# Patient Record
Sex: Female | Born: 1965 | Race: Black or African American | Hispanic: No | Marital: Married | Smoking: Never smoker
Health system: Southern US, Community
[De-identification: ages and names within clinical notes are randomized; demographics above are authoritative.]

## PROBLEM LIST (undated history)

## (undated) DIAGNOSIS — G4730 Sleep apnea, unspecified: Secondary | ICD-10-CM

## (undated) DIAGNOSIS — E785 Hyperlipidemia, unspecified: Secondary | ICD-10-CM

## (undated) DIAGNOSIS — K5900 Constipation, unspecified: Secondary | ICD-10-CM

## (undated) DIAGNOSIS — K219 Gastro-esophageal reflux disease without esophagitis: Secondary | ICD-10-CM

## (undated) DIAGNOSIS — T7840XA Allergy, unspecified, initial encounter: Secondary | ICD-10-CM

## (undated) DIAGNOSIS — R1310 Dysphagia, unspecified: Secondary | ICD-10-CM

## (undated) DIAGNOSIS — E0590 Thyrotoxicosis, unspecified without thyrotoxic crisis or storm: Secondary | ICD-10-CM

## (undated) DIAGNOSIS — I10 Essential (primary) hypertension: Secondary | ICD-10-CM

## (undated) DIAGNOSIS — R197 Diarrhea, unspecified: Secondary | ICD-10-CM

## (undated) DIAGNOSIS — Z8719 Personal history of other diseases of the digestive system: Secondary | ICD-10-CM

## (undated) DIAGNOSIS — E119 Type 2 diabetes mellitus without complications: Secondary | ICD-10-CM

## (undated) DIAGNOSIS — G629 Polyneuropathy, unspecified: Secondary | ICD-10-CM

## (undated) DIAGNOSIS — R011 Cardiac murmur, unspecified: Secondary | ICD-10-CM

## (undated) HISTORY — PX: CHOLECYSTECTOMY: SHX55

## (undated) HISTORY — DX: Allergy, unspecified, initial encounter: T78.40XA

## (undated) HISTORY — PX: ESOPHAGOGASTRODUODENOSCOPY: SHX1529

## (undated) HISTORY — DX: Hyperlipidemia, unspecified: E78.5

## (undated) HISTORY — DX: Essential (primary) hypertension: I10

## (undated) HISTORY — DX: Type 2 diabetes mellitus without complications: E11.9

## (undated) HISTORY — DX: Gastro-esophageal reflux disease without esophagitis: K21.9

## (undated) HISTORY — PX: OTHER SURGICAL HISTORY: SHX169

## (undated) HISTORY — DX: Cardiac murmur, unspecified: R01.1

---

## 2000-07-13 ENCOUNTER — Ambulatory Visit (HOSPITAL_COMMUNITY): Admission: RE | Admit: 2000-07-13 | Discharge: 2000-07-13 | Payer: Self-pay | Admitting: Family Medicine

## 2000-07-13 ENCOUNTER — Encounter: Payer: Self-pay | Admitting: Family Medicine

## 2000-10-14 ENCOUNTER — Encounter: Admission: RE | Admit: 2000-10-14 | Discharge: 2000-10-14 | Payer: Self-pay | Admitting: *Deleted

## 2000-10-14 ENCOUNTER — Encounter: Payer: Self-pay | Admitting: *Deleted

## 2001-02-03 ENCOUNTER — Other Ambulatory Visit: Admission: RE | Admit: 2001-02-03 | Discharge: 2001-02-03 | Payer: Self-pay | Admitting: *Deleted

## 2001-04-16 ENCOUNTER — Emergency Department (HOSPITAL_COMMUNITY): Admission: EM | Admit: 2001-04-16 | Discharge: 2001-04-16 | Payer: Self-pay | Admitting: Emergency Medicine

## 2001-04-18 ENCOUNTER — Encounter: Payer: Self-pay | Admitting: Family Medicine

## 2001-04-18 ENCOUNTER — Ambulatory Visit (HOSPITAL_COMMUNITY): Admission: RE | Admit: 2001-04-18 | Discharge: 2001-04-18 | Payer: Self-pay | Admitting: Family Medicine

## 2001-04-28 ENCOUNTER — Encounter: Payer: Self-pay | Admitting: General Surgery

## 2001-04-28 ENCOUNTER — Encounter (INDEPENDENT_AMBULATORY_CARE_PROVIDER_SITE_OTHER): Payer: Self-pay

## 2001-04-28 ENCOUNTER — Observation Stay (HOSPITAL_COMMUNITY): Admission: RE | Admit: 2001-04-28 | Discharge: 2001-04-29 | Payer: Self-pay | Admitting: General Surgery

## 2002-02-04 ENCOUNTER — Other Ambulatory Visit: Admission: RE | Admit: 2002-02-04 | Discharge: 2002-02-04 | Payer: Self-pay | Admitting: Obstetrics & Gynecology

## 2003-02-17 ENCOUNTER — Other Ambulatory Visit: Admission: RE | Admit: 2003-02-17 | Discharge: 2003-02-17 | Payer: Self-pay | Admitting: *Deleted

## 2003-03-04 ENCOUNTER — Encounter: Admission: RE | Admit: 2003-03-04 | Discharge: 2003-06-02 | Payer: Self-pay | Admitting: Family Medicine

## 2004-01-13 ENCOUNTER — Encounter: Admission: RE | Admit: 2004-01-13 | Discharge: 2004-04-12 | Payer: Self-pay | Admitting: Family Medicine

## 2004-03-01 ENCOUNTER — Other Ambulatory Visit: Admission: RE | Admit: 2004-03-01 | Discharge: 2004-03-01 | Payer: Self-pay | Admitting: *Deleted

## 2004-06-06 ENCOUNTER — Ambulatory Visit (HOSPITAL_COMMUNITY): Admission: RE | Admit: 2004-06-06 | Discharge: 2004-06-06 | Payer: Self-pay | Admitting: Family Medicine

## 2005-01-09 ENCOUNTER — Encounter: Admission: RE | Admit: 2005-01-09 | Discharge: 2005-01-09 | Payer: Self-pay | Admitting: Gastroenterology

## 2005-12-13 ENCOUNTER — Encounter: Admission: RE | Admit: 2005-12-13 | Discharge: 2005-12-13 | Payer: Self-pay | Admitting: *Deleted

## 2006-06-26 ENCOUNTER — Encounter: Admission: RE | Admit: 2006-06-26 | Discharge: 2006-06-26 | Payer: Self-pay | Admitting: Family Medicine

## 2006-10-02 ENCOUNTER — Encounter: Admission: RE | Admit: 2006-10-02 | Discharge: 2006-10-02 | Payer: Self-pay | Admitting: Gastroenterology

## 2006-10-09 ENCOUNTER — Ambulatory Visit (HOSPITAL_COMMUNITY): Admission: RE | Admit: 2006-10-09 | Discharge: 2006-10-09 | Payer: Self-pay | Admitting: Gastroenterology

## 2006-11-05 HISTORY — PX: ESSURE TUBAL LIGATION: SUR464

## 2006-11-06 ENCOUNTER — Ambulatory Visit (HOSPITAL_COMMUNITY): Admission: RE | Admit: 2006-11-06 | Discharge: 2006-11-06 | Payer: Self-pay | Admitting: *Deleted

## 2007-01-01 ENCOUNTER — Encounter: Admission: RE | Admit: 2007-01-01 | Discharge: 2007-01-01 | Payer: Self-pay | Admitting: Family Medicine

## 2007-01-10 ENCOUNTER — Ambulatory Visit (HOSPITAL_COMMUNITY): Admission: RE | Admit: 2007-01-10 | Discharge: 2007-01-10 | Payer: Self-pay | Admitting: *Deleted

## 2007-12-15 ENCOUNTER — Encounter: Admission: RE | Admit: 2007-12-15 | Discharge: 2007-12-15 | Payer: Self-pay | Admitting: *Deleted

## 2008-01-07 ENCOUNTER — Encounter: Admission: RE | Admit: 2008-01-07 | Discharge: 2008-01-07 | Payer: Self-pay | Admitting: *Deleted

## 2008-01-10 ENCOUNTER — Ambulatory Visit (HOSPITAL_COMMUNITY): Admission: RE | Admit: 2008-01-10 | Discharge: 2008-01-10 | Payer: Self-pay | Admitting: Family Medicine

## 2008-01-20 ENCOUNTER — Encounter: Admission: RE | Admit: 2008-01-20 | Discharge: 2008-01-20 | Payer: Self-pay | Admitting: *Deleted

## 2008-06-03 ENCOUNTER — Ambulatory Visit: Payer: Self-pay | Admitting: Cardiothoracic Surgery

## 2008-06-09 ENCOUNTER — Encounter: Admission: RE | Admit: 2008-06-09 | Discharge: 2008-06-09 | Payer: Self-pay | Admitting: Cardiothoracic Surgery

## 2008-06-17 ENCOUNTER — Ambulatory Visit: Payer: Self-pay | Admitting: Cardiothoracic Surgery

## 2008-09-22 ENCOUNTER — Other Ambulatory Visit: Admission: RE | Admit: 2008-09-22 | Discharge: 2008-09-22 | Payer: Self-pay | Admitting: Obstetrics & Gynecology

## 2008-10-01 ENCOUNTER — Encounter: Admission: RE | Admit: 2008-10-01 | Discharge: 2008-10-01 | Payer: Self-pay | Admitting: Obstetrics & Gynecology

## 2008-10-01 LAB — HM DEXA SCAN

## 2009-01-07 ENCOUNTER — Encounter: Admission: RE | Admit: 2009-01-07 | Discharge: 2009-01-07 | Payer: Self-pay | Admitting: Internal Medicine

## 2009-02-28 ENCOUNTER — Ambulatory Visit (HOSPITAL_BASED_OUTPATIENT_CLINIC_OR_DEPARTMENT_OTHER): Admission: RE | Admit: 2009-02-28 | Discharge: 2009-02-28 | Payer: Self-pay | Admitting: Family Medicine

## 2009-02-28 ENCOUNTER — Ambulatory Visit: Payer: Self-pay | Admitting: Diagnostic Radiology

## 2010-01-09 ENCOUNTER — Encounter: Admission: RE | Admit: 2010-01-09 | Discharge: 2010-01-09 | Payer: Self-pay | Admitting: Internal Medicine

## 2010-04-28 ENCOUNTER — Encounter: Admission: RE | Admit: 2010-04-28 | Discharge: 2010-04-28 | Payer: Self-pay | Admitting: Obstetrics & Gynecology

## 2010-05-15 ENCOUNTER — Ambulatory Visit: Payer: Self-pay | Admitting: Thoracic Surgery (Cardiothoracic Vascular Surgery)

## 2010-06-27 ENCOUNTER — Ambulatory Visit: Payer: Self-pay | Admitting: Sports Medicine

## 2010-06-27 DIAGNOSIS — E559 Vitamin D deficiency, unspecified: Secondary | ICD-10-CM | POA: Insufficient documentation

## 2010-06-27 DIAGNOSIS — M79609 Pain in unspecified limb: Secondary | ICD-10-CM | POA: Insufficient documentation

## 2010-06-27 DIAGNOSIS — R269 Unspecified abnormalities of gait and mobility: Secondary | ICD-10-CM | POA: Insufficient documentation

## 2010-06-27 DIAGNOSIS — M2140 Flat foot [pes planus] (acquired), unspecified foot: Secondary | ICD-10-CM | POA: Insufficient documentation

## 2010-06-27 DIAGNOSIS — R030 Elevated blood-pressure reading, without diagnosis of hypertension: Secondary | ICD-10-CM | POA: Insufficient documentation

## 2010-06-27 DIAGNOSIS — M722 Plantar fascial fibromatosis: Secondary | ICD-10-CM | POA: Insufficient documentation

## 2010-07-31 ENCOUNTER — Ambulatory Visit: Payer: Self-pay | Admitting: Sports Medicine

## 2010-11-26 ENCOUNTER — Encounter: Payer: Self-pay | Admitting: *Deleted

## 2010-11-27 ENCOUNTER — Encounter: Payer: Self-pay | Admitting: Cardiothoracic Surgery

## 2010-12-05 NOTE — Assessment & Plan Note (Signed)
Summary: NP,FEET ISSUES,MC   Vital Signs:  Patient profile:   45 year old female Height:      61 inches Weight:      213 pounds BMI:     40.39 BP sitting:   159 / 107  Vitals Entered By: Lillia Pauls CMA (June 27, 2010 8:54 AM)  History of Present Illness: Pt is a 45 yo AAF with bilateral foot pain for 3 years, getting progressively worse. It is a burning sensation and some tingling diffusely in the foot but moreso on the bottom and medial aspect. She has had neg nerve conduction studies, per pt; as well as normal B12 and low vit D for which she takes 2k units a day. She does not have a hx of DM but there is a family hx. She has a hx of plantar fasciitis and still uses her night splints and does her stretching and ice.  Had orthtoics made by Dr Althea Charon and they helped but did not resolve the symptoms - 45 years old?  Social History: works in [employer] office with Dr Erik Obey  Review of Systems General:  Denies chills, fatigue, fever, loss of appetite, malaise, sleep disorder, sweats, weakness, and weight loss. MS:  Complains of stiffness; denies joint pain, joint redness, joint swelling, loss of strength, low back pain, mid back pain, muscle aches, muscle , cramps, muscle weakness, and thoracic pain. Neuro:  Complains of tingling; denies brief paralysis, difficulty with concentration, disturbances in coordination, falling down, headaches, inability to speak, memory loss, numbness, poor balance, seizures, sensation of room spinning, tremors, visual disturbances, and weakness.  Physical Exam  General:  Well-developed,well-nourished,in no acute distress; alert,appropriate and cooperative throughout examination Msk:  normal ROM, no joint tenderness, no joint swelling, and no joint warmth.    Ankle: No visible erythema or swelling. Range of motion is full in all directions. Strength is 5/5 in all directions. Stable lateral and medial ligaments; squeeze test and kleiger test  unremarkable; Talar dome nontender; No pain at base of 5th MT; No tenderness over cuboid; No tenderness over N spot or navicular prominence No tenderness on posterior aspects of lateral and medial malleolus No sign of peroneal tendon subluxations; Negative tarsal tunnel tinel's Able to walk 4 steps.  ttp over proximal plantar fascia, bilaterally  Neurologic:  alert & oriented X3, strength normal in all extremities, sensation intact to light touch, and DTRs symmetrical and normal.  gait reveals overpronation and a dropping of the medial malleoli   Impression & Recommendations:  Problem # 1:  FOOT PAIN, BILATERAL (ICD-729.5) Pt does have an old orthotic, about 45 years old. It was able to be padded with medial arch support and a metatarsal pad. If this works, then she can consider custom orthotics. follow up in 1 month. 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 She will now take Vit B6. She is instructed to f/u with her pcp for her BP and vit D def next week.  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

## 2010-12-05 NOTE — Assessment & Plan Note (Signed)
Summary: orthotics,mc   Vital Signs:  Patient profile:   45 year old female Pulse rate:   74 / minute BP sitting:   140 / 90  (left arm) CC: f/u foot burning and tingling   CC:  f/u foot burning and tingling.  History of Present Illness: Samantha [name] returns w bilat foot pain described as burning somewhat less pain and less burning since we adjusted her old orthotics our plan was to make new ones today do see if we could keep her from needing meds seems like she has tarsal tunnel sxs f pronation bilat  Physical Exam  General:  Well-developed,well-nourished,in no acute distress; alert,appropriate and cooperative throughout examination  mildly obese Msk:  loss of long arch bilat pronated foot stance calcaneal valgus RT > Lt transverse arch is flattened w some splaying of toes Lt > Rt with this   Impression & Recommendations:  Problem # 1:  ABNORMALITY OF GAIT (ICD-781.2)  Patient was fitted for a standard, cushioned, semi-rigid orthotic.  The orthotic was heated and the patient stood on the orthotic blank positioned on the orthotic stand. The patient was positioned in subtalar neutral position and 10 degrees of ankle dorsiflexion in a weight bearing stance. After completion of molding a stable based was applied to the orthotic blank.   The blank was ground to a stable position for weight bearing. size 7 blue swirl base med density blue EVA posting first ray on RT additional orthotic padding  MT pads - small  time 30 mins  gait after placement looks neutral she is comfortable w them in shoes  Orders: [employer], each unit (U0454)  Problem # 2:  FOOT PAIN, BILATERAL (ICD-729.5)  still sxs of tarsal tunnel burning will keep up B6 see how new orthotics do  if no change in 8 wks consider gabapentin  Orders: [employer], each unit (L3002)  Problem # 3:  PLANTAR FASCIITIS, BILATERAL (ICD-728.71)  this is improved w better orthtoic  support  Orders: [employer], each unit ([id number])

## 2010-12-15 ENCOUNTER — Other Ambulatory Visit: Payer: Self-pay | Admitting: Obstetrics & Gynecology

## 2010-12-15 DIAGNOSIS — Z1231 Encounter for screening mammogram for malignant neoplasm of breast: Secondary | ICD-10-CM

## 2011-01-15 ENCOUNTER — Other Ambulatory Visit: Payer: Self-pay | Admitting: Internal Medicine

## 2011-01-15 ENCOUNTER — Ambulatory Visit
Admission: RE | Admit: 2011-01-15 | Discharge: 2011-01-15 | Disposition: A | Discharge status: Home / Self Care | Payer: Federal, State, Local not specified - PPO | Source: Ambulatory Visit | Attending: Obstetrics & Gynecology | Admitting: Obstetrics & Gynecology

## 2011-01-15 DIAGNOSIS — E042 Nontoxic multinodular goiter: Secondary | ICD-10-CM

## 2011-01-15 DIAGNOSIS — Z1231 Encounter for screening mammogram for malignant neoplasm of breast: Secondary | ICD-10-CM

## 2011-01-19 ENCOUNTER — Ambulatory Visit
Admission: RE | Admit: 2011-01-19 | Discharge: 2011-01-19 | Disposition: A | Discharge status: Home / Self Care | Payer: Federal, State, Local not specified - PPO | Source: Ambulatory Visit | Attending: Internal Medicine | Admitting: Internal Medicine

## 2011-01-19 DIAGNOSIS — E042 Nontoxic multinodular goiter: Secondary | ICD-10-CM

## 2011-03-20 NOTE — Assessment & Plan Note (Signed)
OFFICE VISIT   Samantha [name], Samantha [name]  DOB:  1966/07/28                                        June 17, 2008  CHART #:  [id number]   The patient returns today for followup visit with question of vascular  ring from an aberrant right subclavian artery.  The patient was  originally seen in January 2008, after being evaluated by Dr. Loreta Ave in  Gastroenterology and also by ENT.  CT scan showed aberrant right  subclavian artery and also reflux.  The patient returned to see me  several weeks ago with more symptoms of swallowing difficulty and a  repeat barium swallow was performed.  The patient does have slight  indentation in the upper portion of the esophagus consistent with the  aberrant right subclavian artery, but there is no obstruction to flow  and she easily swallowed barium tablets.  It should also be noted that  she had dysmotility of her distal esophagus with contrast pooling in the  distal esophagus and also reflux.   On exam, her blood pressure remains elevated at 175/98 in the left arm  on her previous visit and in the right arm is 163/104.  Otherwise,  physical exam is unchanged.  She has no cervical or supraclavicular  adenopathy or any neck masses.   I have reviewed the films with the patient.  She notes that since  returning to consistently taking over-the-counter Zantac 150 mg a day,  her symptoms have improved over the past several weeks.  At this point,  I would not recommend major intrathoracic vascular procedure without  better evidence that she would have improvement in her overall symptoms  and currently it appears that her symptoms are primarily reflux related.  I am also concerned about her significantly elevated blood pressure on  both of her visits here. She notes that she is not on any blood pressure  medicine.  She notes that at home her blood pressure usually runs at  120/82.  She will return to see Dr. Nicholos Johns to have her  blood  pressure checked further, and we will  reconsult with Dr. Loreta Ave for treatment of her esophageal reflux.  I do  plan to see her back in 6 months to reevaluate her symptoms.   Sheliah Plane, MD  Electronically Signed   EG/MEDQ  D:  06/17/2008  T:  06/18/2008  Job:  [id number]   cc:   Georgianne Fick, M.D.  Anselmo Rod, M.D.

## 2011-03-20 NOTE — Consult Note (Signed)
NEW PATIENT CONSULTATION   [name], [name]  DOB:  1966/02/21                                        May 15, 2010  CHART #:  [id number]   REASON FOR CONSULTATION:  Aberrant right subclavian artery.   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 Tyrone Sage in January 2008.  The patient underwent upper GI barium  contrast swallowing evaluation at that time that revealed no sign of any  significant obstruction of the esophagus at the level of the aberrant  right subclavian artery.  The patient was noted to have significant  nonspecific esophageal dysmotility with some contrast stasis in the  lower thoracic esophagus and related GE reflux that appeared to be  completely unrelated to the aberrant right subclavian artery.  The  patient recently has been undergoing a variety of tests because of  concerns related to increased weight gain over the last 6 months or so.  Thyroid function profile has been found to be normal, and a thyroid  ultrasound revealed no suspicious thyroid mass or acute abnormality.  Because of sensation of increased pressure in her neck and difficulty  sleeping at night, she also underwent a sleep study that revealed no  findings to suggest the presence of obstructive sleep apnea.  The  patient returns to our office for followup related to her aberrant right  subclavian artery, and she specifically requested a second opinion to  evaluate this condition.  She reports no difficulty swallowing at all at  this point in time with exception of the fact that occasionally she will  have trouble swallowing very large pills.  Otherwise, she has no  difficulty swallowing at all and this includes no pain or difficulty  with swallowing either liquids or solids.  She has not had problems  with  chronic cough or recurrent respiratory infections.  She reports a vague  pressure-like sensation in her neck that seems to be exacerbated by  stress and unusual positions of her neck such as tucking her chin.  She  does admit that she has gained at least 25 pounds in weight over the  last 6 months or so.  The remainder of her review of systems is entirely  unremarkable.   PAST MEDICAL HISTORY:  1. Hypertension.  2. Hyperlipidemia.  3. GE reflux disease.  4. Obesity.  5. Bilateral foot pain.   FAMILY HISTORY:  Noncontributory.   SOCIAL HISTORY:  The patient is married with one 62 year old son.  She  lives in [place].  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  She does not smoke and she denies significant  alcohol consumption.   CURRENT MEDICATIONS:  Zantac, Caltrate, Cymbalta, fish oil capsule,  vitamin D, oral contraceptive pill, and multivitamin.   PHYSICAL EXAMINATION:  Notable for well-appearing obese female with  blood pressure 151/98, pulse 92, and oxygen saturation 99% on room air.  HEENT exam is notable for the absence of any palpable lymphadenopathy.  No thyroid masses can be appreciated.  Auscultation of the chest reveals  clear breath sounds that are symmetrical bilaterally.  No wheezes,  rales, or rhonchi are noted.  Cardiovascular exam includes regular rate  and rhythm.  No murmurs, rubs, or gallops are noted.  The abdomen is  soft and nontender.  The extremities are warm and well perfused.  There  is no lower extremity edema.  Distal pulses are palpable.   DIAGNOSTIC TESTS:  Chest CT scan performed October 02, 2006, is  reviewed.  This demonstrates aberrant right subclavian artery arising  from the proximal descending thoracic aorta and traversing posterior to  the esophagus.  There is otherwise normal left sided aortic arch and no  other arch anomalies are appreciated.  Upper GI contrast study performed  August 2009, is  reviewed.  This demonstrates very slight indentation of  the esophagus at the level of the aberrant right subclavian artery with  no evidence for obstruction whatsoever.  Abnormal motility was noted at  that time as discussed previously.   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  At her age, it would be somewhat unusual for this  condition to ever require surgical intervention.  She denies any  symptoms of possible esophageal obstruction and previous upper  gastrointestinal contrast study revealed no evidence for obstruction.   RECOMMENDATIONS:  I do not recommend any further workup at this time.  If the patient develops significant symptoms of possible esophageal  obstruction, then repeat upper GI barium contrast study would be  indicated to evaluate the source of obstruction.  Repeat CT angiogram  with more dedicated examination of the anatomy might be useful as well,  although at this time I  think it would be entirely a waste of resources.  All of her questions  have been addressed.  The patient will call or return to see [place] as  needed in the future.   Samantha [name], M.D.  Electronically Signed   CHO/MEDQ  D:  05/15/2010  T:  05/16/2010  Job:  [id number]   cc:   Samantha [name], M.D.  Samantha Keas, MD

## 2011-03-20 NOTE — Assessment & Plan Note (Signed)
OFFICE VISIT   Samantha [name], Samantha [name]  DOB:  07-Nov-1965                                        June 03, 2008  CHART #:  [id number]   The patient was originally seen in January of 2008, for a question of  vascular ring.  She is a 45 year old female who had noticed some  discomfort in her posterior throat and was evaluated by ENT and  Gastroenterology, and ultimately referred to me for an apparent left  subclavian artery.  At that time, her studies were reviewed.  Her  symptoms were minimal and there was no evidence of deformity,  indentation, or dysfunction of her esophagus on esophagram, and did not  think a major vascular procedure was warranted at that time.  The  patient returns today for followup.  She notes some intermittent  sensation of difficulty swallowing, but is not constant.  She is able to  take a diet without difficulty, but came for reevaluation.   PHYSICAL EXAMINATION:  VITAL SIGNS:  Her blood pressure in the right arm  was 163/104, pulse was 88, respiratory rate was 18, and O2 sats were  99%.  NECK:  On exam, I do not appreciate any cervical or supraclavicular  adenopathy.  There is no cervical bruits appreciated.  EXTREMITIES:  Pulses are equal in both arms.  CARDIAC:  Regular rate and rhythm without murmur or gallop.   The patient's previous studies were reviewed after discussing whether we  decide to proceed with a repeat esophageal swallow to evaluate the upper  esophagus particularly and then I will see her back in the office to  further discuss the evaluation and then decide if CT scan or MRI is  indicated  to evaluate, if there has been any change in the size of the subclavian  artery or potential development of aneurysmal dilatation of the  subclavian artery and its apparent position.   Sheliah Plane, MD  Electronically Signed   EG/MEDQ  D:  06/03/2008  T:  06/04/2008  Job:  [id number]   cc:   Anselmo Rod, M.D.  Georgianne Fick, M.D.

## 2011-03-23 NOTE — Op Note (Signed)
Samantha [name], Samantha [name]             ACCOUNT NO.:  [id number]   MEDICAL RECORD NO.:  [id number]          PATIENT TYPE:  AMB   LOCATION:  [facility]                           FACILITY:  [facility]   PHYSICIAN:  Knowlton B. Earlene Plater, M.D.  DATE OF BIRTH:  November 16, 1965   DATE OF PROCEDURE:  11/06/2006  DATE OF DISCHARGE:                               OPERATIVE REPORT   PREOPERATIVE DIAGNOSIS:  Desires tubal sterilization.   POSTOPERATIVE DIAGNOSIS:  Desires tubal sterilization.   PROCEDURE:  Essure tubal sterilization.   SURGEON:  Chester Holstein. Earlene Plater, M.D.   ASSISTANT:  None.   ANESTHESIA:  MAC and 20 mL of 1% Nesacaine paracervical block.   SPECIMENS:  None.   BLOOD LOSS:  Minimal.   COMPLICATIONS:  None.   FLUID DEFICIT:  75 mL.   FINDINGS:  Normal appearing uterine cavity, no masses, good endometrial  suppression, 12 coils were visible on the left, 6 on the right, after  placement.   INDICATIONS:  The patient desires permanent tubal sterilization.  Preferred the Essure for its minimally invasive aspects and the fastest  possible recovery time.  The patient was advised of the risks of surgery  including infection, bleeding, perforation, damage to surrounding  organs.  In addition, she was counseled regarding ectopic risks should  failure occur as well as the 5% to 10% nonvisualization or  noncannulation rate with Essure and the 3 month post op HSG per FDA.   PROCEDURE:  The patient was taken to the operating room and MAC  anesthesia obtained.  She was placed in Worton stirrups, prepped and  draped in the standard fashion.  The bladder was emptied with in and out  cath.  Examination under anesthesia showed a retroverted normal size  uterus and no adnexal masses.   Speculum inserted.  Paracervical block placed.  Anterior lip of the  cervix grasped with a single tooth.  The uterus was sounded and was  confirmed to be retroinverted. The Essure scope was inserted after being  flushed with good  uterine distention and the tubal ostia were  visualized.  The left was approached first and the Essure device  inserted to the black depth marker and the coil released in the standard  fashion.  12 coils were viable after placement.  Procedure repeated on  the right in the same manner and 6 coils were visible prior to placement  of the device.   The scope was removed and the cervix was hemostatic. The patient  tolerated the procedure well.  There were no complications.  She was  taken to the recovery room in stable condition.      Gerri Spore B. Earlene Plater, M.D.  Electronically Signed     WBD/MEDQ  D:  11/06/2006  T:  11/06/2006  Job:  [id number]

## 2011-03-23 NOTE — Op Note (Signed)
[facility]  Patient:    Samantha [name], Samantha [name]                    MRN: [id number] Proc. Date: 04/28/01 Adm. Date:  [id number] Attending:  Glenna Fellows Tappan                           Operative Report  PREOPERATIVE DIAGNOSIS:  Symptomatic cholelithiasis.  POSTOPERATIVE DIAGNOSIS:  Symptomatic cholelithiasis.  PROCEDURE:  Laparoscopic cholecystectomy with intraoperative cholangiogram.  ASSISTANT:  Dr. Mosetta Anis.  ANESTHESIA:  General.  BRIEF HISTORY:  This patient is a healthy 45 year old female who presents with episodic severe epigastric abdominal pain. She has been evaluated in the emergency room and found to have minimally elevated LFTs with an AST of 52 and subsequently a gallbladder ultrasound was obtained which reveals multiple gallstones and a normal common bile duct. Laparoscopic cholecystectomy with cholangiogram has been recommended and accepted. The nature of the procedure, its indications and risks of bleeding, infection, bile leak and bile duct injury were discussed and understood preoperatively. The patient is now brought to the operating room for this procedure.  DESCRIPTION OF PROCEDURE:  The patient was brought to the operating room and placed in supine position on the operating table and general endotracheal anesthesia was induced. The abdomen was sterilely prepped and draped. She received preoperative antibiotics. PAS were place. Local anesthesia was used to infiltrate the trocar sites prior to the incision. A 1 cm incision was made at the umbilicus and dissection carried down the midline fascia. This was sharply incised for 1 cm and the peritoneum entered under direct vision. Through a mattress suture of #0 Vicryl, the Hasson trocar was inserted, pneumoperitoneum established. Under direct vision, the 10 mm trocar was placed in the subxiphoid area and two 5 mm trocars along the right subcostal margin. The gallbladder was  visualized and the fundus grasped and elevated up over the liver and the infundibulum retracted inferolaterally. The gallbladder was literally packed with stones. There was some mild thickening of the gallbladder wall. Fibrofatty tissue was stripped down off the neck of the gallbladder and toward the porta hepatis. The distal gallbladder was thoroughly dissected. The gallbladder cystic duct junction was identified and the cystic duct dissected over about a centimeter and the cystic duct gallbladder junction dissected 360 degrees. Calots triangle was thoroughly dissected and the cystic artery identified coursing up on the gallbladder wall. When the anatomy was clear, the cystic duct was clipped at its junction with the gallbladder and operative cholangiogram obtained through the cystic duct. This showed good filling of the intrahepatic ducts and common bile duct with free flow into the duodenum and no filling defects. Following this, the cholangiogcath was removed and the cystic duct was doubly clipped proximally and divided. Anterior and posterior branches of the cystic artery were controlled between clips and divided. The gallbladder was dissected free from its hook and the spatula cautery and removed through the umbilicus. Complete hemostasis was assured in the operative site. Trocars removed under direct vision. All CO2 evacuated from the peritoneal cavity. The pursestring suture was secured at the umbilicus. The skin incisions were closed subcuticular 4-0 monocryl and Steri-Strips. Sponge, needle and instrument counts were correct. A dry sterile dressing was applied and the patient taken to the recovery room in good condition. DD:  04/28/01 TD:  04/28/01 Job: [id number]

## 2011-11-16 ENCOUNTER — Other Ambulatory Visit: Payer: Self-pay | Admitting: Obstetrics & Gynecology

## 2011-11-16 DIAGNOSIS — Z1231 Encounter for screening mammogram for malignant neoplasm of breast: Secondary | ICD-10-CM

## 2012-01-21 ENCOUNTER — Ambulatory Visit
Admission: RE | Admit: 2012-01-21 | Discharge: 2012-01-21 | Disposition: A | Discharge status: Home / Self Care | Payer: Federal, State, Local not specified - PPO | Source: Ambulatory Visit | Attending: Obstetrics & Gynecology | Admitting: Obstetrics & Gynecology

## 2012-01-21 DIAGNOSIS — Z1231 Encounter for screening mammogram for malignant neoplasm of breast: Secondary | ICD-10-CM

## 2012-10-31 ENCOUNTER — Other Ambulatory Visit: Payer: Self-pay | Admitting: Obstetrics & Gynecology

## 2012-10-31 DIAGNOSIS — Z1231 Encounter for screening mammogram for malignant neoplasm of breast: Secondary | ICD-10-CM

## 2012-11-24 LAB — HM PAP SMEAR: HM Pap smear: NEGATIVE

## 2013-01-26 ENCOUNTER — Ambulatory Visit
Admission: RE | Admit: 2013-01-26 | Discharge: 2013-01-26 | Disposition: A | Discharge status: Home / Self Care | Payer: Federal, State, Local not specified - PPO | Source: Ambulatory Visit | Attending: Obstetrics & Gynecology | Admitting: Obstetrics & Gynecology

## 2013-01-26 DIAGNOSIS — Z1231 Encounter for screening mammogram for malignant neoplasm of breast: Secondary | ICD-10-CM

## 2013-02-06 ENCOUNTER — Other Ambulatory Visit: Payer: Self-pay | Admitting: Family Medicine

## 2013-02-06 DIAGNOSIS — E042 Nontoxic multinodular goiter: Secondary | ICD-10-CM

## 2013-02-09 ENCOUNTER — Other Ambulatory Visit: Payer: Federal, State, Local not specified - PPO

## 2013-02-09 ENCOUNTER — Ambulatory Visit
Admission: RE | Admit: 2013-02-09 | Discharge: 2013-02-09 | Disposition: A | Discharge status: Home / Self Care | Payer: Federal, State, Local not specified - PPO | Source: Ambulatory Visit | Attending: Family Medicine | Admitting: Family Medicine

## 2013-02-09 DIAGNOSIS — E042 Nontoxic multinodular goiter: Secondary | ICD-10-CM

## 2013-08-27 ENCOUNTER — Ambulatory Visit (INDEPENDENT_AMBULATORY_CARE_PROVIDER_SITE_OTHER): Payer: Federal, State, Local not specified - PPO | Admitting: General Surgery

## 2013-09-01 ENCOUNTER — Encounter (INDEPENDENT_AMBULATORY_CARE_PROVIDER_SITE_OTHER): Payer: Self-pay

## 2013-09-01 ENCOUNTER — Encounter (INDEPENDENT_AMBULATORY_CARE_PROVIDER_SITE_OTHER): Payer: Self-pay | Admitting: General Surgery

## 2013-09-01 ENCOUNTER — Ambulatory Visit (INDEPENDENT_AMBULATORY_CARE_PROVIDER_SITE_OTHER): Payer: Federal, State, Local not specified - PPO | Admitting: General Surgery

## 2013-09-01 VITALS — BP 126/82 | HR 60 | Temp 98.2°F | Resp 14 | Ht 61.0 in | Wt 219.4 lb

## 2013-09-01 DIAGNOSIS — R1033 Periumbilical pain: Secondary | ICD-10-CM | POA: Insufficient documentation

## 2013-09-01 NOTE — Patient Instructions (Signed)
Plan for CT to look for umbilical hernia

## 2013-09-03 ENCOUNTER — Other Ambulatory Visit: Payer: Federal, State, Local not specified - PPO

## 2013-09-05 DIAGNOSIS — E119 Type 2 diabetes mellitus without complications: Secondary | ICD-10-CM

## 2013-09-05 HISTORY — DX: Type 2 diabetes mellitus without complications: E11.9

## 2013-09-07 ENCOUNTER — Ambulatory Visit
Admission: RE | Admit: 2013-09-07 | Discharge: 2013-09-07 | Disposition: A | Discharge status: Home / Self Care | Payer: Federal, State, Local not specified - PPO | Source: Ambulatory Visit | Attending: General Surgery | Admitting: General Surgery

## 2013-09-07 DIAGNOSIS — R1033 Periumbilical pain: Secondary | ICD-10-CM

## 2013-09-07 MED ORDER — IOHEXOL 300 MG/ML  SOLN
125.0000 mL | Freq: Once | INTRAMUSCULAR | Status: AC | PRN
  Administered 2013-09-07: 125 mL via INTRAVENOUS

## 2013-09-09 ENCOUNTER — Telehealth (INDEPENDENT_AMBULATORY_CARE_PROVIDER_SITE_OTHER): Payer: Self-pay

## 2013-09-09 NOTE — Telephone Encounter (Signed)
LMOM> CT did not show an umbilical hernia. It does show a ventral hernia. I cannot see Dr Billey Chang office note so I am not sure where her abdominal pain is or if the pain is coming from this ventral hernia. Will have to discuss this next week with Dr Carolynne Edouard when he is back in the office.

## 2013-09-09 NOTE — Telephone Encounter (Signed)
Message copied by Brennan Bailey on Wed Sep 09, 2013  4:38 PM ------      Message from: Isaias Sakai K      Created: Wed Sep 09, 2013  9:29 AM      Regarding: Dr Alvester Morin: [phone]       Wants CT scan results ------

## 2013-09-10 NOTE — Telephone Encounter (Signed)
Pt returned call. Pt advised of attached msg from [place]. Pt will await call back after test results reviewed with Dr Carolynne Edouard.

## 2013-09-17 NOTE — Telephone Encounter (Signed)
LMOM> Dr Carolynne Edouard reviewed CT images. He states he sees a small defect around umbilical area that looks like umbilical hernia even though the report said she does not have one. She will need to make a follow up visit with him to discuss surgery if she is wanting this surgically repaired.

## 2013-09-22 NOTE — Telephone Encounter (Signed)
Pt calling in asking for clarification of message left by [facility].  Relayed message below.  Pt unsure if she wishes to proceed with surgery.  Advised her to call our office if/when she has made a decision.

## 2013-09-23 NOTE — Progress Notes (Signed)
Patient ID: Samantha [name], female   DOB: May 05, 1966, 47 y.o.   MRN: [id number]  Chief Complaint  Patient presents with  . New Evaluation    eval RIH    HPI Samantha [name] is a 47 y.o. female.  We are asked to see the pt in consultation by Dr. Hyacinth Meeker to evaluate her for a ventral hernia. The pt is a 47 yo female who has been experiencing pain near her umbilicus for the last month. She notices it mostly when she leans over a washer. She denies any nausea or vomiting. She does have problems with constipation.  HPI  Past Medical History  Diagnosis Date  . GERD (gastroesophageal reflux disease)   . Heart murmur   . Hyperlipidemia   . Hypertension     Past Surgical History  Procedure Laterality Date  . Cholecystectomy      Family History  Problem Relation Age of Onset  . Cancer Mother     ovarian    Social History History  Substance Use Topics  . Smoking status: Never Smoker   . Smokeless tobacco: Never Used  . Alcohol Use: No    Allergies  Allergen Reactions  . Latex Hives    Current Outpatient Prescriptions  Medication Sig Dispense Refill  . cetirizine (ZYRTEC) 10 MG tablet Take 10 mg by mouth daily.      . fish oil-omega-3 fatty acids 1000 MG capsule Take 2 g by mouth daily.      Marland Kitchen JOLIVETTE 0.35 MG tablet       . LYRICA 100 MG capsule       . omeprazole (PRILOSEC) 40 MG capsule        No current facility-administered medications for this visit.    Review of Systems Review of Systems  Constitutional: Negative.   HENT: Negative.   Eyes: Negative.   Respiratory: Negative.   Cardiovascular: Negative.   Gastrointestinal: Positive for abdominal pain and constipation. Negative for vomiting and abdominal distention.  Endocrine: Negative.   Genitourinary: Negative.   Musculoskeletal: Negative.   Skin: Negative.   Allergic/Immunologic: Negative.   Neurological: Negative.   Hematological: Negative.   Psychiatric/Behavioral: Negative.     Blood pressure  126/82, pulse 60, temperature 98.2 F (36.8 C), temperature source Temporal, resp. rate 14, height 5\' 1"  (1.549 m), weight 219 lb 6.4 oz (99.519 kg).  Physical Exam Physical Exam  Constitutional: She is oriented to person, place, and time. She appears well-developed and well-nourished.  HENT:  Head: Normocephalic and atraumatic.  Eyes: Conjunctivae and EOM are normal. Pupils are equal, round, and reactive to light.  Neck: Normal range of motion. Neck supple.  Cardiovascular: Normal rate, regular rhythm and normal heart sounds.   Pulmonary/Chest: Effort normal and breath sounds normal.  Abdominal: Soft. Bowel sounds are normal.  She does have tenderness centrally. I do not feel a definite fascial defect  Musculoskeletal: Normal range of motion.  Neurological: She is alert and oriented to person, place, and time.  Skin: Skin is warm and dry.  Psychiatric: She has a normal mood and affect. Her behavior is normal.    Data Reviewed As above  Assessment    The pt has abdominal pain but it is difficult to appreciate a fascial defect that would be indicative of a hernia. Because of this I would recommend getting a CT of the abdomen and pelvis to look for evidence of a hernia that could be causing her pain     Plan  Plan for CT abd/pelvis. We will call her with the results of the study and then proceed accordingly        TOTH III,PAUL S 09/23/2013, 9:46 AM

## 2013-10-30 ENCOUNTER — Ambulatory Visit (INDEPENDENT_AMBULATORY_CARE_PROVIDER_SITE_OTHER): Payer: Federal, State, Local not specified - PPO | Admitting: General Surgery

## 2013-10-30 ENCOUNTER — Encounter (INDEPENDENT_AMBULATORY_CARE_PROVIDER_SITE_OTHER): Payer: Self-pay

## 2013-10-30 ENCOUNTER — Encounter (INDEPENDENT_AMBULATORY_CARE_PROVIDER_SITE_OTHER): Payer: Self-pay | Admitting: General Surgery

## 2013-10-30 VITALS — BP 125/81 | HR 81 | Temp 98.1°F | Resp 14 | Ht 61.0 in | Wt 214.0 lb

## 2013-10-30 DIAGNOSIS — K439 Ventral hernia without obstruction or gangrene: Secondary | ICD-10-CM

## 2013-10-30 NOTE — Progress Notes (Signed)
Patient ID: Samantha [name], female   DOB: [birth year], 47 y.o.   MRN: [id number]  Chief Complaint  Patient presents with  . Follow-up    HPI Samantha [name] is a 47 y.o. female.  The patient is a 47 year old black female who we saw recently with some abdominal pain. Since her last visit we had her undergo a CT scan of her abdomen which did show a small ventral hernia just above the umbilicus with some fat within the hernia. She has not had any nausea or vomiting. She continues to have pain across her abdomen. She denies any fevers or chills.  HPI  Past Medical History  Diagnosis Date  . GERD (gastroesophageal reflux disease)   . Heart murmur   . Hyperlipidemia   . Hypertension     Past Surgical History  Procedure Laterality Date  . Cholecystectomy      Family History  Problem Relation Age of Onset  . Cancer Mother     ovarian    Social History History  Substance Use Topics  . Smoking status: Never Smoker   . Smokeless tobacco: Never Used  . Alcohol Use: No    Allergies  Allergen Reactions  . Latex Hives    Current Outpatient Prescriptions  Medication Sig Dispense Refill  . cetirizine (ZYRTEC) 10 MG tablet Take 10 mg by mouth daily.      . fish oil-omega-3 fatty acids 1000 MG capsule Take 2 g by mouth daily.      . hydrochlorothiazide (HYDRODIURIL) 25 MG tablet       . JOLIVETTE 0.35 MG tablet       . LYRICA 100 MG capsule       . metformin (FORTAMET) 1000 MG (OSM) 24 hr tablet       . NATURE-THROID 81.25 MG TABS       . omeprazole (PRILOSEC) 40 MG capsule        No current facility-administered medications for this visit.    Review of Systems Review of Systems  Constitutional: Negative.   HENT: Negative.   Eyes: Negative.   Respiratory: Negative.   Cardiovascular: Negative.   Gastrointestinal: Positive for abdominal pain.  Endocrine: Negative.   Genitourinary: Negative.   Musculoskeletal: Negative.   Skin: Negative.   Allergic/Immunologic:  Negative.   Neurological: Negative.   Hematological: Negative.   Psychiatric/Behavioral: Negative.     Blood pressure 125/81, pulse 81, temperature 98.1 F (36.7 C), temperature source Temporal, resp. rate 14, height 5\' 1"  (1.549 m), weight 214 lb (97.07 kg).  Physical Exam Physical Exam  Constitutional: She is oriented to person, place, and time. She appears well-developed and well-nourished.  HENT:  Head: Normocephalic and atraumatic.  Eyes: Conjunctivae and EOM are normal. Pupils are equal, round, and reactive to light.  Neck: Normal range of motion. Neck supple.  Cardiovascular: Normal rate, regular rhythm and normal heart sounds.   Pulmonary/Chest: Effort normal and breath sounds normal.  Abdominal: Soft. Bowel sounds are normal. There is tenderness.  Musculoskeletal: Normal range of motion.  Lymphadenopathy:    She has no cervical adenopathy.  Neurological: She is alert and oriented to person, place, and time.  Skin: Skin is warm and dry.  Psychiatric: She has a normal mood and affect. Her behavior is normal.    Data Reviewed As above  Assessment    The patient does appear to have a small upper midline ventral hernia with some fat within the hernia. Because of the risk of incarceration and strangulation  and the risk of the hernia getting larger I think she would benefit from having this fixed. She would also like to have this done. I've discussed with her in detail the risks and benefits of the operation to fix the hernia as well as some of the technical aspects including use of mesh and she understands and wishes to proceed     Plan    Plan for laparoscopic ventral hernia repair with mesh        TOTH III,Samantha [name] 10/30/2013, 4:31 PM

## 2013-11-09 ENCOUNTER — Other Ambulatory Visit: Payer: Self-pay | Admitting: Nurse Practitioner

## 2013-11-09 NOTE — Telephone Encounter (Signed)
Patient has AEX scheduled for 11/27/13//kn

## 2013-11-26 ENCOUNTER — Encounter: Payer: Self-pay | Admitting: Nurse Practitioner

## 2013-11-27 ENCOUNTER — Ambulatory Visit (INDEPENDENT_AMBULATORY_CARE_PROVIDER_SITE_OTHER): Payer: Federal, State, Local not specified - PPO | Admitting: Nurse Practitioner

## 2013-11-27 ENCOUNTER — Encounter: Payer: Self-pay | Admitting: Nurse Practitioner

## 2013-11-27 VITALS — BP 140/86 | HR 72 | Ht 61.0 in | Wt 213.0 lb

## 2013-11-27 DIAGNOSIS — Z0000 Encounter for general adult medical examination without abnormal findings: Secondary | ICD-10-CM

## 2013-11-27 DIAGNOSIS — M8580 Other specified disorders of bone density and structure, unspecified site: Secondary | ICD-10-CM

## 2013-11-27 DIAGNOSIS — Z01419 Encounter for gynecological examination (general) (routine) without abnormal findings: Secondary | ICD-10-CM

## 2013-11-27 DIAGNOSIS — M899 Disorder of bone, unspecified: Secondary | ICD-10-CM

## 2013-11-27 DIAGNOSIS — M949 Disorder of cartilage, unspecified: Secondary | ICD-10-CM

## 2013-11-27 DIAGNOSIS — E559 Vitamin D deficiency, unspecified: Secondary | ICD-10-CM

## 2013-11-27 LAB — POCT URINALYSIS DIPSTICK
Bilirubin, UA: NEGATIVE
Blood, UA: NEGATIVE
Glucose, UA: NEGATIVE
Ketones, UA: NEGATIVE
Leukocytes, UA: NEGATIVE
Nitrite, UA: NEGATIVE
Protein, UA: NEGATIVE
Urobilinogen, UA: NEGATIVE
pH, UA: 5

## 2013-11-27 LAB — HEMOGLOBIN, FINGERSTICK: Hemoglobin, fingerstick: 14.5 g/dL (ref 12.0–16.0)

## 2013-11-27 NOTE — Progress Notes (Signed)
Patient ID: Samantha DeutscherJennifer L Ruacho, female   DOB: 02-09-1966, 48 y.o.   MRN: [id number] 48 y.o. G2P0011 Married African American Fe here for annual exam. Menses for past few years are at 3-4 months apart. When periods occur they are light and last for 3 days. No cramps. On [employer]ronor POP since 11/2006.  Getting ready to have umbilical hernia repair in February. Had HGB AIC in November secondary to neuropathy in legs.  So lab was elevated and started on Metformin and Lyrica for pain.  Last HGB AIC was 6.4.    Patient's last menstrual period was 07/06/2013.          Sexually active: yes  The current method of family planning is oral progesterone-only contraceptive.   Since 11/2006 Exercising: yes  Home exercise routine includes walking 30 minutes or 2 miles 3 days per week. Smoker:  no  Health Maintenance: Pap: 11/24/12, WNL, neg HR HPV MMG: 01/27/13, Bi-Rads:  negative BMD:  10/01/08, mild osteopenia in spine TDaP:  11/24/12 Labs:  HB:  14.5 Urine: negative   reports that she has never smoked. She has never used smokeless tobacco. She reports that she does not drink alcohol or use illicit drugs.  Past Medical History  Diagnosis Date  . GERD (gastroesophageal reflux disease)   . Heart murmur   . Hyperlipidemia   . Hypertension   . Diabetes mellitus without complication 09/2013    Past Surgical History  Procedure Laterality Date  . Cholecystectomy    . Essure tubal ligation  11/2006    one tube still open    Current Outpatient Prescriptions  Medication Sig Dispense Refill  . b complex vitamins tablet Take 1 tablet by mouth daily.      . Calcium Carbonate (CALTRATE 600 PO) Take by mouth.      . cetirizine (ZYRTEC) 10 MG tablet Take 10 mg by mouth daily.      . hydrochlorothiazide (HYDRODIURIL) 25 MG tablet       . JOLIVETTE 0.35 MG tablet TAKE ONE TABLET BY MOUTH EVERY DAY  84 tablet  0  . LYRICA 100 MG capsule       . metformin (FORTAMET) 1000 MG (OSM) 24 hr tablet       . NATURE-THROID  81.25 MG TABS        No current facility-administered medications for this visit.    Family History  Problem Relation Age of Onset  . Multiple births Mother   . Ovarian cancer Mother   . Hypertension Mother   . Hypertension Father   . Heart disease Father   . Heart attack Father   . Diabetes Brother   . Thyroid disease Paternal Aunt   . Thyroid disease Paternal Grandmother   . Osteoporosis Other   . Hypertension Brother   . Hypertension Sister     ROS:  Pertinent items are noted in HPI.  Otherwise, a comprehensive ROS was negative.  Exam:   BP 140/86  Pulse 72  Ht 5\' 1"  (1.549 m)  Wt 213 lb (96.616 kg)  BMI 40.27 kg/m2  LMP 07/06/2013 Height: 5\' 1"  (154.9 cm)  Ht Readings from Last 3 Encounters:  11/27/13 5\' 1"  (1.549 m)  10/30/13 5\' 1"  (1.549 m)  09/01/13 5\' 1"  (1.549 m)    General appearance: alert, cooperative and appears stated age Head: Normocephalic, without obvious abnormality, atraumatic Neck: no adenopathy, supple, symmetrical, trachea midline and thyroid normal to inspection and palpation Lungs: clear to auscultation bilaterally Breasts: normal appearance, no  masses or tenderness Heart: regular rate and rhythm Abdomen: soft, non-tender; no masses,  no organomegaly Extremities: extremities normal, atraumatic, no cyanosis or edema Skin: Skin color, texture, turgor normal. No rashes or lesions Lymph nodes: Cervical, supraclavicular, and axillary nodes normal. No abnormal inguinal nodes palpated Neurologic: Grossly normal   Pelvic: External genitalia:  no lesions              Urethra:  normal appearing urethra with no masses, tenderness or lesions              Bartholin's and Skene's: normal                 Vagina: normal appearing vagina with normal color and discharge, no lesions              Cervix: anteverted              Pap taken: no Bimanual Exam:  Uterus:  normal size, contour, position, consistency, mobility, non-tender              Adnexa: no  mass, fullness, tenderness               Rectovaginal: Confirms               Anus:  normal sphincter tone, no lesions  A:  Well Woman with normal exam  Perimenopausal on POP since 11/2006 with light and irregular menses  S/P Essure for birth control 11/2006 with one tube that is open   Vit D deficiency  osteopenia  P:   Pap smear as per guidelines   Mammogram is due 01/2014 and will get BMD at that time  Refill POP for a year  Check Vit D and follow  Counseled on breast self exam, mammography screening, adequate intake of calcium and vitamin D, diet and exercise return annually or prn  An After Visit Summary was printed and given to the patient.

## 2013-11-27 NOTE — Patient Instructions (Signed)

## 2013-11-28 LAB — VITAMIN D 25 HYDROXY (VIT D DEFICIENCY, FRACTURES): Vit D, 25-Hydroxy: 43 ng/mL (ref 30–89)

## 2013-12-02 NOTE — Progress Notes (Signed)
Reviewed personally.  M. Suzanne Cathalina Barcia, MD.  

## 2013-12-03 ENCOUNTER — Encounter (HOSPITAL_COMMUNITY): Payer: Self-pay | Admitting: Pharmacy Technician

## 2013-12-07 ENCOUNTER — Telehealth (INDEPENDENT_AMBULATORY_CARE_PROVIDER_SITE_OTHER): Payer: Self-pay | Admitting: *Deleted

## 2013-12-07 ENCOUNTER — Encounter (HOSPITAL_COMMUNITY): Payer: Self-pay

## 2013-12-07 ENCOUNTER — Encounter (HOSPITAL_COMMUNITY): Payer: Self-pay | Admitting: Emergency Medicine

## 2013-12-07 ENCOUNTER — Encounter (HOSPITAL_COMMUNITY)
Admission: RE | Admit: 2013-12-07 | Discharge: 2013-12-07 | Disposition: A | Discharge status: Home / Self Care | Payer: Federal, State, Local not specified - PPO | Source: Ambulatory Visit | Attending: Anesthesiology | Admitting: Anesthesiology

## 2013-12-07 ENCOUNTER — Emergency Department (HOSPITAL_COMMUNITY)
Admission: EM | Admit: 2013-12-07 | Discharge: 2013-12-07 | Disposition: A | Discharge status: Home / Self Care | Payer: Federal, State, Local not specified - PPO | Attending: Emergency Medicine | Admitting: Emergency Medicine

## 2013-12-07 ENCOUNTER — Telehealth (INDEPENDENT_AMBULATORY_CARE_PROVIDER_SITE_OTHER): Payer: Self-pay | Admitting: General Surgery

## 2013-12-07 ENCOUNTER — Other Ambulatory Visit: Payer: Self-pay

## 2013-12-07 ENCOUNTER — Ambulatory Visit (HOSPITAL_COMMUNITY)
Admission: RE | Admit: 2013-12-07 | Discharge: 2013-12-07 | Disposition: A | Discharge status: Home / Self Care | Payer: Federal, State, Local not specified - PPO | Source: Ambulatory Visit | Attending: Anesthesiology | Admitting: Anesthesiology

## 2013-12-07 DIAGNOSIS — E669 Obesity, unspecified: Secondary | ICD-10-CM | POA: Insufficient documentation

## 2013-12-07 DIAGNOSIS — K429 Umbilical hernia without obstruction or gangrene: Secondary | ICD-10-CM

## 2013-12-07 DIAGNOSIS — G4730 Sleep apnea, unspecified: Secondary | ICD-10-CM | POA: Insufficient documentation

## 2013-12-07 DIAGNOSIS — I10 Essential (primary) hypertension: Secondary | ICD-10-CM | POA: Insufficient documentation

## 2013-12-07 DIAGNOSIS — G609 Hereditary and idiopathic neuropathy, unspecified: Secondary | ICD-10-CM | POA: Insufficient documentation

## 2013-12-07 DIAGNOSIS — Z9851 Tubal ligation status: Secondary | ICD-10-CM | POA: Insufficient documentation

## 2013-12-07 DIAGNOSIS — R109 Unspecified abdominal pain: Secondary | ICD-10-CM

## 2013-12-07 DIAGNOSIS — Z01812 Encounter for preprocedural laboratory examination: Secondary | ICD-10-CM

## 2013-12-07 DIAGNOSIS — Z9089 Acquired absence of other organs: Secondary | ICD-10-CM | POA: Insufficient documentation

## 2013-12-07 DIAGNOSIS — R197 Diarrhea, unspecified: Secondary | ICD-10-CM | POA: Insufficient documentation

## 2013-12-07 DIAGNOSIS — Z79899 Other long term (current) drug therapy: Secondary | ICD-10-CM | POA: Insufficient documentation

## 2013-12-07 DIAGNOSIS — Z01818 Encounter for other preprocedural examination: Secondary | ICD-10-CM | POA: Insufficient documentation

## 2013-12-07 DIAGNOSIS — E0590 Thyrotoxicosis, unspecified without thyrotoxic crisis or storm: Secondary | ICD-10-CM | POA: Insufficient documentation

## 2013-12-07 DIAGNOSIS — R011 Cardiac murmur, unspecified: Secondary | ICD-10-CM | POA: Insufficient documentation

## 2013-12-07 DIAGNOSIS — Z9981 Dependence on supplemental oxygen: Secondary | ICD-10-CM | POA: Insufficient documentation

## 2013-12-07 DIAGNOSIS — Z91040 Latex allergy status: Secondary | ICD-10-CM | POA: Insufficient documentation

## 2013-12-07 DIAGNOSIS — Z1231 Encounter for screening mammogram for malignant neoplasm of breast: Secondary | ICD-10-CM

## 2013-12-07 DIAGNOSIS — K219 Gastro-esophageal reflux disease without esophagitis: Secondary | ICD-10-CM | POA: Insufficient documentation

## 2013-12-07 DIAGNOSIS — Z9889 Other specified postprocedural states: Secondary | ICD-10-CM | POA: Insufficient documentation

## 2013-12-07 DIAGNOSIS — E119 Type 2 diabetes mellitus without complications: Secondary | ICD-10-CM | POA: Insufficient documentation

## 2013-12-07 DIAGNOSIS — K469 Unspecified abdominal hernia without obstruction or gangrene: Secondary | ICD-10-CM

## 2013-12-07 HISTORY — DX: Polyneuropathy, unspecified: G62.9

## 2013-12-07 HISTORY — DX: Dysphagia, unspecified: R13.10

## 2013-12-07 HISTORY — DX: Diarrhea, unspecified: R19.7

## 2013-12-07 HISTORY — DX: Constipation, unspecified: K59.00

## 2013-12-07 HISTORY — DX: Personal history of other diseases of the digestive system: Z87.19

## 2013-12-07 HISTORY — DX: Thyrotoxicosis, unspecified without thyrotoxic crisis or storm: E05.90

## 2013-12-07 HISTORY — DX: Sleep apnea, unspecified: G47.30

## 2013-12-07 LAB — BASIC METABOLIC PANEL
BUN: 13 mg/dL (ref 6–23)
CO2: 29 mEq/L (ref 19–32)
Calcium: 9.6 mg/dL (ref 8.4–10.5)
Chloride: 97 mEq/L (ref 96–112)
Creatinine, Ser: 0.71 mg/dL (ref 0.50–1.10)
GFR calc Af Amer: >90 mL/min (ref >90)
GFR calc non Af Amer: >90 mL/min (ref >90)
Glucose, Bld: 104 mg/dL — ABNORMAL HIGH (ref 70–99)
Potassium: 3.2 mEq/L — ABNORMAL LOW (ref 3.7–5.3)
Sodium: 142 mEq/L (ref 137–147)

## 2013-12-07 LAB — CBC
HCT: 43.2 % (ref 36.0–46.0)
Hemoglobin: 14.8 g/dL (ref 12.0–15.0)
MCH: 27.8 pg (ref 26.0–34.0)
MCHC: 34.3 g/dL (ref 30.0–36.0)
MCV: 81.1 fL (ref 78.0–100.0)
Platelets: 310 10*3/uL (ref 150–400)
RBC: 5.33 MIL/uL — ABNORMAL HIGH (ref 3.87–5.11)
RDW: 14.7 % (ref 11.5–15.5)
WBC: 10 10*3/uL (ref 4.0–10.5)

## 2013-12-07 LAB — HCG, SERUM, QUALITATIVE: Preg, Serum: NEGATIVE

## 2013-12-07 MED ORDER — CHLORHEXIDINE GLUCONATE 4 % EX LIQD: 1.0000 "application " | Freq: Once | CUTANEOUS | Status: DC

## 2013-12-07 NOTE — Telephone Encounter (Signed)
Pre-op called to report that patient was there for her appt but was experiencing severe pain.  Patient reported [facility] and has been having severe pain since.  Pre-op is sending patient to the ED at this time for evaluation.  They just wanted Dr. Carolynne Edouardoth to be aware.

## 2013-12-07 NOTE — Pre-Procedure Instructions (Signed)
Samantha DeutscherJennifer L [name]  12/07/2013   Your procedure is scheduled on:  Mon, Feb 9 @ 7:30 AM  [facility]  at 5:30 AM.  Call this number if you have problems the morning of surgery: [phone]   Remember:   Do not eat food or drink liquids after midnight.   Take these medicines the morning of surgery with A SIP OF WATER: Zyrtec(Cetirizine),Nature Throid,and Omeprazole(Prilosec)              No Goody's,BC's,Aleve,Aspirin,Ibuprofen,Fish Oil,or any Herbal Medications   Do not wear jewelry, make-up or nail polish.  Do not wear lotions, powders, or perfumes. You may wear deodorant.  Do not shave 48 hours prior to surgery. Men may shave face and neck.  Do not bring valuables to the hospital.  [facility] not responsible                  for any belongings or valuables.               Contacts, dentures or bridgework may not be worn into surgery.  Leave suitcase in the car. After surgery it may be brought to your room.  For patients admitted to the hospital, discharge time is determined by your                treatment team.               Patients discharged the day of surgery will not be allowed to drive  home.    Special Instructions: Shower using CHG 2 nights before surgery and the night before surgery.  If you shower the day of surgery use CHG.  Use special wash - you have one bottle of CHG for all showers.  You should use approximately 1/3 of the bottle for each shower.   Please read over the following fact sheets that you were given: Pain Booklet, Coughing and Deep Breathing and Surgical Site Infection Prevention

## 2013-12-07 NOTE — ED Provider Notes (Signed)
Medical screening examination/treatment/procedure(s) were performed by non-physician practitioner and as supervising physician I was immediately available for consultation/collaboration.  EKG Interpretation   None         Zi Newbury H Gurtej Noyola, MD 12/07/13 2243 

## 2013-12-07 NOTE — Progress Notes (Signed)
Spoke with Belenda CruiseKristin in nursing office with Dr.Toth-notified of her pain and pain level and taking pt to ED-will let Dr.Toth know

## 2013-12-07 NOTE — ED Notes (Addendum)
Presents with umbilical hernia, appointment to have hernia repair on Monday FEb 9th, today had pre op and during barium swallow began having severe lower-mid abdominal pain. Pain is getting better but pre operation RN sent pt here due to pain and for a possible CT. Denies nausea denies vomiting, abdomen distended, soft, tender to palpation. REports loose stools, ongoing for some time.  Pt had blood drawn at pre op.

## 2013-12-07 NOTE — Telephone Encounter (Signed)
Pt called to ask if she should go to the hospital for pain

## 2013-12-07 NOTE — Discharge Instructions (Signed)
Abdominal Pain, Adult  Many things can cause belly (abdominal) pain. Most times, the belly pain is not dangerous. Many cases of belly pain can be watched and treated at home.  HOME CARE   · Do not take medicines that help you go poop (laxatives) unless told to by your doctor.  · Only take medicine as told by your doctor.  · Eat or drink as told by your doctor. Your doctor will tell you if you should be on a special diet.  GET HELP IF:  · You do not know what is causing your belly pain.  · You have belly pain while you are sick to your stomach (nauseous) or have runny poop (diarrhea).  · You have pain while you pee or poop.  · Your belly pain wakes you up at night.  · You have belly pain that gets worse or better when you eat.  · You have belly pain that gets worse when you eat fatty foods.  GET HELP RIGHT AWAY IF:   · The pain does not go away within 2 hours.  · You have a fever.  · You keep throwing up (vomiting).  · The pain changes and is only in the right or left part of the belly.  · You have bloody or tarry looking poop.  MAKE SURE YOU:   · Understand these instructions.  · Will watch your condition.  · Will get help right away if you are not doing well or get worse.  Document Released: 04/09/2008 Document Revised: 08/12/2013 Document Reviewed: 07/01/2013  Exit[employer]® Patient Information ©2014 [employer].

## 2013-12-07 NOTE — Progress Notes (Addendum)
[facility] and since then she has been having constant pain,horrible pain rated 9

## 2013-12-07 NOTE — Telephone Encounter (Signed)
Pt called to report discomfort from umbilical hernia to be repaired soon.  She asked if she should go to the ER.  Advised if the pain is not severe/ excruciating and constant, okay to wait.  Consider abdominal binder for additional support.  Explained how to place it.  She understands.

## 2013-12-07 NOTE — Telephone Encounter (Signed)
A note regarding Duloxetine was entered on this pt in ERROR.  The note was intended for another pt.

## 2013-12-07 NOTE — ED Provider Notes (Signed)
CSN: [id number]     Arrival date & time 12/07/13  1523 History   First MD Initiated Contact with Patient 12/07/13 1931     Chief Complaint  Patient presents with  . Hernia   (Consider location/radiation/quality/duration/timing/severity/associated sxs/prior Treatment) HPI Pt is a 48yo female with hx of umbilical hernia sent to ED after pre-op for umbilical hernia repair as pt was c/o abdominal pain that started earlier this morning after barium swallow.  Pt stated after barium swallow she started to have gradually worsening severe lower to mid-abdominal pain. At the time, pain was aching and throbbing. Pt stated pain was only made better by walking back and forth. Reports having 1 episode of diarrhea at home but states since recent worsening of hernia, diarrhea is not new for her.  Now, pt states pain has subsided substantially, only 2/10, mildly tender.  Has not had pain medication PTA.  Denies fever, n/v/d. Denies abdominal distension.  Pt did have blood drawn at pre-op which was normal.  Past Medical History  Diagnosis Date  . Heart murmur   . Hyperlipidemia     but no meds required   . Hypertension     takes HCTZ daily  . GERD (gastroesophageal reflux disease)     takes Omeprazole daily  . Constipation   . Diarrhea   . Hyperthyroidism     takes [employer] daily  . Diabetes mellitus without complication 09/2013    takes Metformin daily  . Peripheral neuropathy     takes Lyrica daily  . Dysphagia   . H/O hiatal hernia   . Sleep apnea     uses CPAP-study done in Dec 2014-to request report from [facility] in [place]   Past Surgical History  Procedure Laterality Date  . Cholecystectomy    . Essure tubal ligation  11/2006    one tube still open  . Tcs    . Esophagogastroduodenoscopy     Family History  Problem Relation Age of Onset  . Multiple births Mother   . Ovarian cancer Mother   . Hypertension Mother   . Hypertension Father   . Heart disease Father   .  Heart attack Father   . Diabetes Brother   . Thyroid disease Paternal Aunt   . Thyroid disease Paternal Grandmother   . Osteoporosis Other   . Hypertension Brother   . Hypertension Sister    History  Substance Use Topics  . Smoking status: Never Smoker   . Smokeless tobacco: Never Used  . Alcohol Use: No   OB History   Grav Para Term Preterm Abortions TAB SAB Ect Mult Living   2 1   1  1   1      Review of Systems  Constitutional: Negative for fever and chills.  Respiratory: Negative for shortness of breath.   Cardiovascular: Negative for chest pain.  Gastrointestinal: Positive for abdominal pain and diarrhea. Negative for nausea, vomiting, constipation and blood in stool.  Genitourinary: Negative for dysuria, flank pain, vaginal discharge, vaginal pain and pelvic pain.  Musculoskeletal: Negative for back pain.  All other systems reviewed and are negative.    Allergies  Latex  Home Medications   Current Outpatient Rx  Name  Route  Sig  Dispense  Refill  . b complex vitamins tablet   Oral   Take 1 tablet by mouth daily.         . Calcium Carbonate (CALTRATE 600 PO)   Oral   Take 600 mg by  mouth daily.          . cetirizine (ZYRTEC) 10 MG tablet   Oral   Take 10 mg by mouth daily.         . hydrochlorothiazide (HYDRODIURIL) 25 MG tablet   Oral   Take 25 mg by mouth daily.          . metformin (FORTAMET) 1000 MG (OSM) 24 hr tablet   Oral   Take 1,000 mg by mouth daily with breakfast.          . NATURE-THROID 81.25 MG TABS   Oral   Take 81.25 mg by mouth daily.          . norethindrone (JOLIVETTE) 0.35 MG tablet   Oral   Take 1 tablet by mouth daily.         Marland Kitchen. omeprazole (PRILOSEC) 40 MG capsule   Oral   Take 40 mg by mouth daily.         . pregabalin (LYRICA) 100 MG capsule   Oral   Take 200 mg by mouth at bedtime.          BP 141/75  Pulse 64  Temp(Src) 98.4 F (36.9 C) (Oral)  Resp 16  Wt 211 lb (95.709 kg)  SpO2 100%   LMP 07/06/2013 Physical Exam  Nursing note and vitals reviewed. Constitutional: She appears well-developed and well-nourished. No distress.  Pt lying comfortably in exam bed, NAD.   HENT:  Head: Normocephalic and atraumatic.  Eyes: Conjunctivae are normal. No scleral icterus.  Neck: Normal range of motion.  Cardiovascular: Normal rate, regular rhythm and normal heart sounds.   Pulmonary/Chest: Effort normal and breath sounds normal. No respiratory distress. She has no wheezes. She has no rales. She exhibits no tenderness.  Abdominal: Soft. Bowel sounds are normal. She exhibits mass. She exhibits no distension. There is no tenderness. There is no rebound and no guarding.  Obese abdomen, soft, non-distended, non-tender.  Easily reducible umbilical hernia.   Musculoskeletal: Normal range of motion.  Neurological: She is alert.  Skin: Skin is warm and dry. She is not diaphoretic.    ED Course  Procedures (including critical care time) Labs Review Labs Reviewed - No data to display Imaging Review Dg Chest 2 View  12/07/2013   CLINICAL DATA:  Pre operative respiratory exam for umbilical hernia repair.  EXAM: CHEST - 2 VIEW  COMPARISON:  CT CHEST W/CM dated 10/02/2006  FINDINGS: The heart size and mediastinal contours are within normal limits. There is no evidence of pulmonary edema, consolidation, pneumothorax, nodule or pleural fluid. . The visualized skeletal structures are unremarkable. A small amount of barium is identified in the distal stomach and proximal duodenum.  IMPRESSION: No active disease.   Electronically Signed   By: Irish LackGlenn  Yamagata M.D.   On: 12/07/2013 16:15    EKG Interpretation   None       MDM   1. Abdominal hernia   2. Abdominal discomfort    Pt presenting for possible CT scan of abdomen due to abdominal pain after barium swallow. Pt was seen for pre-op earlier this afternoon for umbilical hernia repair scheduled for Monday, 12/14/13.  Labs drawn during pre-op:  normal.  Pt sent for further evaluation of abdominal pain.  7:59 PM In ED, pt states pain has improved greatly, 2/10, mild tenderness.  On exam, pt appears well, non-toxic.  Small umbilical hernia palpated on exam with mild tenderness, easily reducible. No rebound or guarding.   Discussed pt  with Dr. Silverio Lay, no further testing or imaging needed at this time. Advised pt to f/u as scheduled with PCP and general surgeon for scheduled hernia repair.  Return precautions provided. Pt verbalized understanding and agreement with tx plan.    Junius Finner, PA-C 12/07/13 2001

## 2013-12-07 NOTE — Telephone Encounter (Signed)
Pt called on her way from her PCP office back to work, asking if it is okay to take a newly prescribed (to her) medication.  Her PCP wanted Dr. Daphine DeutscherMartin to okay her taking Cymbalta (Duloxetine HCl) 60 mg QD for "mood disorder."  Please call her at work:  [phone], ext [phone].

## 2013-12-07 NOTE — Progress Notes (Signed)
Saw Dr.Ganji in Dec 2013-was sent by medical MD d/t hearing a heart murmur(born with it) states this was a new medical MD for her  [facility]  Denies ever having a stress test/heart cath  EKG done in Nov 2014-to be requested from DR. Nicholos JohnsRamachandran  Denies CXR in past yr

## 2013-12-07 NOTE — Progress Notes (Signed)
Spoke with Matthew FolksBerkley in the ED about bringing pt down and about her rating her abdominal pain 9 on 0-10 scale.Pain is constant

## 2013-12-08 NOTE — Progress Notes (Signed)
Message left on Samantha [name]'s voice mail related to ekg and last OV.

## 2013-12-09 NOTE — Progress Notes (Signed)
Message left again with Misty StanleyLisa in medical records at Dr Teola Bradleyamachandrun's office for EKG and OV notes [phone])

## 2013-12-11 NOTE — Progress Notes (Signed)
Anesthesia Chart Review:  Patient is a 48 year old female scheduled for laparoscopic VHR on 12/14/13 by Dr. Carolynne Edouardoth.  History includes non-smoker, obesity, HLD, HTN, GERD, DM2, hiatal hernia, OSA, cholecystectomy, "hyperthyroidism" listed but is actually on "Nature Thyroid" which is used with hypothyroidism.  Her TSH was mildly suppressed however at 0.320 (normal 0.34-4.82) with normal free T4 on 09/25/13.  Her PCP Dr. Georgianne FickAjith Ramachandran is monitoring.    EKG on 09/25/13 (PCP) showed SB @ 59 bpm, possible anterior infarct (age undetermined), low QRS voltages in precordial leads.  Dr. Nicholos Johnsamachandran felt it was unchanged.  Echo on 09/29/12 (ordered by PCP due to finding of murmur, read by Dr. Jacinto HalimGanji) showed: Normal LV cavity size, wall motion and systolic function, EF 58%, LA cavity upper limits of normal, mild AR/MR/TR, no pulmonary HTN.   Preoperative CXR and labs noted.    Anticipate that she can proceed as planned.  Velna Ochsllison Caitlin Hillmer, PA-C [facility]iology Phone [phone] 12/11/2013 12:50 PM

## 2013-12-13 MED ORDER — CEFAZOLIN SODIUM-DEXTROSE 2-3 GM-% IV SOLR: 2.0000 g | INTRAVENOUS | Status: DC

## 2013-12-14 ENCOUNTER — Encounter (HOSPITAL_COMMUNITY): Payer: Federal, State, Local not specified - PPO | Admitting: Vascular Surgery

## 2013-12-14 ENCOUNTER — Encounter (HOSPITAL_COMMUNITY): Payer: Self-pay | Admitting: Surgery

## 2013-12-14 ENCOUNTER — Ambulatory Visit (HOSPITAL_COMMUNITY): Payer: Federal, State, Local not specified - PPO | Admitting: Certified Registered"

## 2013-12-14 ENCOUNTER — Encounter (HOSPITAL_COMMUNITY)
Admission: RE | Disposition: A | Discharge status: Home / Self Care | Payer: Self-pay | Source: Ambulatory Visit | Attending: General Surgery

## 2013-12-14 ENCOUNTER — Inpatient Hospital Stay (HOSPITAL_COMMUNITY)
Admission: RE | Admit: 2013-12-14 | Discharge: 2013-12-17 | DRG: 354 | Disposition: A | Discharge status: Home / Self Care | Payer: Federal, State, Local not specified - PPO | Source: Ambulatory Visit | Attending: General Surgery | Admitting: General Surgery

## 2013-12-14 DIAGNOSIS — K439 Ventral hernia without obstruction or gangrene: Secondary | ICD-10-CM | POA: Diagnosis present

## 2013-12-14 DIAGNOSIS — K219 Gastro-esophageal reflux disease without esophagitis: Secondary | ICD-10-CM | POA: Diagnosis present

## 2013-12-14 DIAGNOSIS — E785 Hyperlipidemia, unspecified: Secondary | ICD-10-CM | POA: Diagnosis present

## 2013-12-14 DIAGNOSIS — Z79899 Other long term (current) drug therapy: Secondary | ICD-10-CM

## 2013-12-14 DIAGNOSIS — K5900 Constipation, unspecified: Principal | ICD-10-CM | POA: Diagnosis present

## 2013-12-14 DIAGNOSIS — Z6841 Body Mass Index (BMI) 40.0 and over, adult: Secondary | ICD-10-CM

## 2013-12-14 DIAGNOSIS — G4730 Sleep apnea, unspecified: Secondary | ICD-10-CM | POA: Diagnosis present

## 2013-12-14 DIAGNOSIS — K560 Paralytic ileus: Secondary | ICD-10-CM | POA: Diagnosis not present

## 2013-12-14 DIAGNOSIS — E119 Type 2 diabetes mellitus without complications: Secondary | ICD-10-CM | POA: Diagnosis present

## 2013-12-14 DIAGNOSIS — I10 Essential (primary) hypertension: Secondary | ICD-10-CM | POA: Diagnosis present

## 2013-12-14 HISTORY — PX: VENTRAL HERNIA REPAIR: SHX424

## 2013-12-14 HISTORY — PX: INSERTION OF MESH: SHX5868

## 2013-12-14 LAB — GLUCOSE, CAPILLARY
Glucose-Capillary: 104 mg/dL — ABNORMAL HIGH (ref 70–99)
Glucose-Capillary: 105 mg/dL — ABNORMAL HIGH (ref 70–99)
Glucose-Capillary: 99 mg/dL (ref 70–99)
Glucose-Capillary: 124 mg/dL — ABNORMAL HIGH (ref 70–99)
Glucose-Capillary: 100 mg/dL — ABNORMAL HIGH (ref 70–99)

## 2013-12-14 SURGERY — REPAIR, HERNIA, VENTRAL, LAPAROSCOPIC
Anesthesia: General | Site: Abdomen

## 2013-12-14 MED ORDER — THYROID 81.25 MG PO TABS: 81.2500 mg | ORAL_TABLET | Freq: Every day | ORAL | Status: DC

## 2013-12-14 MED ORDER — LORATADINE 10 MG PO TABS
10.0000 mg | ORAL_TABLET | Freq: Every day | ORAL | Status: DC
  Administered 2013-12-14 – 2013-12-17 (×4): 10 mg via ORAL
  Filled 2013-12-14 (×5): qty 1

## 2013-12-14 MED ORDER — OXYCODONE HCL 5 MG PO TABS: 5.0000 mg | ORAL_TABLET | Freq: Once | ORAL | Status: DC | PRN

## 2013-12-14 MED ORDER — BUPIVACAINE-EPINEPHRINE (PF) 0.25% -1:200000 IJ SOLN
INTRAMUSCULAR | Status: AC
  Filled 2013-12-14: qty 30

## 2013-12-14 MED ORDER — LIDOCAINE HCL (CARDIAC) 20 MG/ML IV SOLN
INTRAVENOUS | Status: AC
  Filled 2013-12-14: qty 5

## 2013-12-14 MED ORDER — FENTANYL CITRATE 0.05 MG/ML IJ SOLN
INTRAMUSCULAR | Status: AC
  Filled 2013-12-14: qty 5

## 2013-12-14 MED ORDER — ROCURONIUM BROMIDE 50 MG/5ML IV SOLN
INTRAVENOUS | Status: AC
  Filled 2013-12-14: qty 1

## 2013-12-14 MED ORDER — GLYCOPYRROLATE 0.2 MG/ML IJ SOLN
INTRAMUSCULAR | Status: AC
  Filled 2013-12-14: qty 2

## 2013-12-14 MED ORDER — PREGABALIN 75 MG PO CAPS
200.0000 mg | ORAL_CAPSULE | Freq: Every day | ORAL | Status: DC
  Administered 2013-12-14 – 2013-12-16 (×3): 200 mg via ORAL
  Filled 2013-12-14 (×2): qty 1
  Filled 2013-12-14: qty 2
  Filled 2013-12-14: qty 1
  Filled 2013-12-14 (×2): qty 2

## 2013-12-14 MED ORDER — KCL IN DEXTROSE-NACL 20-5-0.9 MEQ/L-%-% IV SOLN
INTRAVENOUS | Status: DC
  Administered 2013-12-14 – 2013-12-16 (×5): via INTRAVENOUS
  Filled 2013-12-14 (×7): qty 1000

## 2013-12-14 MED ORDER — ROCURONIUM BROMIDE 100 MG/10ML IV SOLN
INTRAVENOUS | Status: DC | PRN
  Administered 2013-12-14: 10 mg via INTRAVENOUS
  Administered 2013-12-14: 40 mg via INTRAVENOUS

## 2013-12-14 MED ORDER — OXYCODONE-ACETAMINOPHEN 5-325 MG PO TABS
1.0000 | ORAL_TABLET | ORAL | Status: DC | PRN
  Administered 2013-12-14 – 2013-12-17 (×6): 2 via ORAL
  Filled 2013-12-14 (×6): qty 2

## 2013-12-14 MED ORDER — NEOSTIGMINE METHYLSULFATE 1 MG/ML IJ SOLN
INTRAMUSCULAR | Status: AC
  Filled 2013-12-14: qty 10

## 2013-12-14 MED ORDER — EPHEDRINE SULFATE 50 MG/ML IJ SOLN
INTRAMUSCULAR | Status: AC
  Filled 2013-12-14: qty 1

## 2013-12-14 MED ORDER — OXYCODONE HCL 5 MG/5ML PO SOLN: 5.0000 mg | Freq: Once | ORAL | Status: DC | PRN

## 2013-12-14 MED ORDER — NEOSTIGMINE METHYLSULFATE 1 MG/ML IJ SOLN
INTRAMUSCULAR | Status: DC | PRN
  Administered 2013-12-14: 3 mg via INTRAVENOUS
  Administered 2013-12-14 (×2): 1 mg via INTRAVENOUS

## 2013-12-14 MED ORDER — PROPOFOL 10 MG/ML IV BOLUS
INTRAVENOUS | Status: DC | PRN
  Administered 2013-12-14: 200 mg via INTRAVENOUS

## 2013-12-14 MED ORDER — HYDROMORPHONE HCL PF 1 MG/ML IJ SOLN
INTRAMUSCULAR | Status: AC
  Filled 2013-12-14: qty 1

## 2013-12-14 MED ORDER — MORPHINE SULFATE 4 MG/ML IJ SOLN
4.0000 mg | INTRAMUSCULAR | Status: DC | PRN
  Administered 2013-12-14 – 2013-12-16 (×3): 4 mg via INTRAVENOUS
  Filled 2013-12-14 (×3): qty 1

## 2013-12-14 MED ORDER — FENTANYL CITRATE 0.05 MG/ML IJ SOLN
INTRAMUSCULAR | Status: DC | PRN
  Administered 2013-12-14: 50 ug via INTRAVENOUS
  Administered 2013-12-14: 100 ug via INTRAVENOUS
  Administered 2013-12-14 (×3): 50 ug via INTRAVENOUS

## 2013-12-14 MED ORDER — PHENYLEPHRINE 40 MCG/ML (10ML) SYRINGE FOR IV PUSH (FOR BLOOD PRESSURE SUPPORT)
PREFILLED_SYRINGE | INTRAVENOUS | Status: AC
  Filled 2013-12-14: qty 10

## 2013-12-14 MED ORDER — HYDROMORPHONE HCL PF 1 MG/ML IJ SOLN
0.2500 mg | INTRAMUSCULAR | Status: DC | PRN
  Administered 2013-12-14: 0.25 mg via INTRAVENOUS
  Administered 2013-12-14 (×3): 0.5 mg via INTRAVENOUS
  Administered 2013-12-14: 0.25 mg via INTRAVENOUS

## 2013-12-14 MED ORDER — LACTATED RINGERS IV SOLN
INTRAVENOUS | Status: DC | PRN
  Administered 2013-12-14 (×2): via INTRAVENOUS

## 2013-12-14 MED ORDER — PANTOPRAZOLE SODIUM 40 MG PO TBEC
40.0000 mg | DELAYED_RELEASE_TABLET | Freq: Every day | ORAL | Status: DC
  Administered 2013-12-14 – 2013-12-17 (×4): 40 mg via ORAL
  Filled 2013-12-14 (×4): qty 1

## 2013-12-14 MED ORDER — MIDAZOLAM HCL 2 MG/2ML IJ SOLN
INTRAMUSCULAR | Status: AC
  Filled 2013-12-14: qty 2

## 2013-12-14 MED ORDER — ONDANSETRON HCL 4 MG/2ML IJ SOLN
INTRAMUSCULAR | Status: DC | PRN
  Administered 2013-12-14: 4 mg via INTRAVENOUS

## 2013-12-14 MED ORDER — METHOCARBAMOL 500 MG PO TABS
500.0000 mg | ORAL_TABLET | Freq: Three times a day (TID) | ORAL | Status: DC
  Administered 2013-12-14 – 2013-12-17 (×9): 500 mg via ORAL
  Filled 2013-12-14 (×13): qty 1

## 2013-12-14 MED ORDER — 0.9 % SODIUM CHLORIDE (POUR BTL) OPTIME
TOPICAL | Status: DC | PRN
  Administered 2013-12-14: 1000 mL

## 2013-12-14 MED ORDER — SUCCINYLCHOLINE CHLORIDE 20 MG/ML IJ SOLN
INTRAMUSCULAR | Status: AC
  Filled 2013-12-14: qty 1

## 2013-12-14 MED ORDER — GLYCOPYRROLATE 0.2 MG/ML IJ SOLN
INTRAMUSCULAR | Status: DC | PRN
  Administered 2013-12-14: 0.2 mg via INTRAVENOUS
  Administered 2013-12-14: 0.4 mg via INTRAVENOUS

## 2013-12-14 MED ORDER — BUPIVACAINE-EPINEPHRINE 0.25% -1:200000 IJ SOLN
INTRAMUSCULAR | Status: DC | PRN
  Administered 2013-12-14: 21 mL

## 2013-12-14 MED ORDER — ONDANSETRON HCL 4 MG/2ML IJ SOLN
INTRAMUSCULAR | Status: AC
  Filled 2013-12-14: qty 2

## 2013-12-14 MED ORDER — GLYCOPYRROLATE 0.2 MG/ML IJ SOLN
INTRAMUSCULAR | Status: AC
  Filled 2013-12-14: qty 1

## 2013-12-14 MED ORDER — PROMETHAZINE HCL 25 MG/ML IJ SOLN: 6.2500 mg | INTRAMUSCULAR | Status: DC | PRN

## 2013-12-14 MED ORDER — PROPOFOL 10 MG/ML IV BOLUS
INTRAVENOUS | Status: AC
  Filled 2013-12-14: qty 20

## 2013-12-14 MED ORDER — ONDANSETRON HCL 4 MG/2ML IJ SOLN
4.0000 mg | Freq: Four times a day (QID) | INTRAMUSCULAR | Status: DC | PRN
Start: 2013-12-14 — End: 2013-12-17

## 2013-12-14 MED ORDER — SODIUM CHLORIDE 0.9 % IJ SOLN
INTRAMUSCULAR | Status: AC
  Filled 2013-12-14: qty 10

## 2013-12-14 MED ORDER — HYDROCHLOROTHIAZIDE 25 MG PO TABS
25.0000 mg | ORAL_TABLET | Freq: Every day | ORAL | Status: DC
  Administered 2013-12-14 – 2013-12-17 (×4): 25 mg via ORAL
  Filled 2013-12-14 (×5): qty 1

## 2013-12-14 MED ORDER — METFORMIN HCL ER 500 MG PO TB24
1000.0000 mg | ORAL_TABLET | Freq: Every day | ORAL | Status: DC
  Administered 2013-12-15 – 2013-12-17 (×3): 1000 mg via ORAL
  Filled 2013-12-14 (×5): qty 2

## 2013-12-14 MED ORDER — MIDAZOLAM HCL 5 MG/5ML IJ SOLN
INTRAMUSCULAR | Status: DC | PRN
  Administered 2013-12-14: 2 mg via INTRAVENOUS

## 2013-12-14 MED ORDER — LIDOCAINE HCL (CARDIAC) 20 MG/ML IV SOLN
INTRAVENOUS | Status: DC | PRN
Start: 2013-12-14 — End: 2013-12-14
  Administered 2013-12-14: 100 mg via INTRAVENOUS

## 2013-12-14 MED ORDER — ONDANSETRON HCL 4 MG PO TABS: 4.0000 mg | ORAL_TABLET | Freq: Four times a day (QID) | ORAL | Status: DC | PRN

## 2013-12-14 SURGICAL SUPPLY — 38 items
ADH SKN CLS APL DERMABOND .7 (GAUZE/BANDAGES/DRESSINGS) ×1
BINDER ABD UNIV 12 45-62 (WOUND CARE) IMPLANT
BINDER ABDOMINAL 46IN 62IN (WOUND CARE) ×3
CHLORAPREP W/TINT 26ML (MISCELLANEOUS) ×3 IMPLANT
COVER SURGICAL LIGHT HANDLE (MISCELLANEOUS) ×3 IMPLANT
DERMABOND ADVANCED (GAUZE/BANDAGES/DRESSINGS) ×2
DERMABOND ADVANCED .7 DNX12 (GAUZE/BANDAGES/DRESSINGS) ×1 IMPLANT
DEVICE SECURE STRAP 25 ABSORB (INSTRUMENTS) ×5 IMPLANT
DEVICE TROCAR PUNCTURE CLOSURE (ENDOMECHANICALS) ×3 IMPLANT
DRAPE INCISE IOBAN 66X45 STRL (DRAPES) ×3 IMPLANT
DRAPE UTILITY 15X26 W/TAPE STR (DRAPE) ×6 IMPLANT
ELECT CAUTERY BLADE 6.4 (BLADE) ×3 IMPLANT
ELECT REM PT RETURN 9FT ADLT (ELECTROSURGICAL) ×3
ELECTRODE REM PT RTRN 9FT ADLT (ELECTROSURGICAL) ×1 IMPLANT
GLOVE BIOGEL PI IND STRL 7.0 (GLOVE) IMPLANT
GLOVE BIOGEL PI IND STRL 7.5 (GLOVE) IMPLANT
GLOVE BIOGEL PI INDICATOR 7.0 (GLOVE) ×2
GLOVE BIOGEL PI INDICATOR 7.5 (GLOVE) ×2
GLOVE SURG SS PI 7.0 STRL IVOR (GLOVE) ×6 IMPLANT
GLOVE SURG SS PI 7.5 STRL IVOR (GLOVE) ×4 IMPLANT
GOWN STRL NON-REIN LRG LVL3 (GOWN DISPOSABLE) ×9 IMPLANT
KIT BASIN OR (CUSTOM PROCEDURE TRAY) ×3 IMPLANT
KIT ROOM TURNOVER OR (KITS) ×3 IMPLANT
MARKER SKIN DUAL TIP RULER LAB (MISCELLANEOUS) ×3 IMPLANT
MESH VENTRALIGHT ST 4X6IN (Mesh General) ×2 IMPLANT
NDL SPNL 22GX3.5 QUINCKE BK (NEEDLE) ×1 IMPLANT
NEEDLE SPNL 22GX3.5 QUINCKE BK (NEEDLE) ×3 IMPLANT
NS IRRIG 1000ML POUR BTL (IV SOLUTION) ×3 IMPLANT
PAD ARMBOARD 7.5X6 YLW CONV (MISCELLANEOUS) ×3 IMPLANT
PENCIL BUTTON HOLSTER BLD 10FT (ELECTRODE) ×3 IMPLANT
SCALPEL HARMONIC ACE (MISCELLANEOUS) ×2 IMPLANT
SLEEVE ENDOPATH XCEL 5M (ENDOMECHANICALS) ×5 IMPLANT
SUT MNCRL AB 4-0 PS2 18 (SUTURE) ×5 IMPLANT
SUT NOVA NAB DX-16 0-1 5-0 T12 (SUTURE) ×5 IMPLANT
TOWEL OR 17X26 10 PK STRL BLUE (TOWEL DISPOSABLE) ×3 IMPLANT
TRAY FOLEY BAG SILVER LF 16FR (CATHETERS) ×2 IMPLANT
TRAY LAPAROSCOPIC (CUSTOM PROCEDURE TRAY) ×3 IMPLANT
TROCAR XCEL NON-BLD 5MMX100MML (ENDOMECHANICALS) ×3 IMPLANT

## 2013-12-14 NOTE — Preoperative (Signed)
Beta Blockers   Reason not to administer Beta Blockers:Not Applicable 

## 2013-12-14 NOTE — Op Note (Signed)
12/14/2013  9:22 AM  PATIENT:  Samantha [name]  48 y.o. female  PRE-OPERATIVE DIAGNOSIS:  VENTRAL HERNIA  POST-OPERATIVE DIAGNOSIS:  VENTRAL HERNIA  PROCEDURE:  Procedure(s): LAPAROSCOPIC VENTRAL HERNIA (N/A) INSERTION OF MESH (N/A)  SURGEON:  Surgeon(s) and Role:    * Robyne AskewPaul S Toth III, MD - Primary  PHYSICIAN ASSISTANT:   ASSISTANTS: none   ANESTHESIA:   general  EBL:  Total I/O In: 1200 [I.V.:1200] Out: 250 [Urine:200; Blood:50]  BLOOD ADMINISTERED:none  DRAINS: none   LOCAL MEDICATIONS USED:  MARCAINE     SPECIMEN:  No Specimen  DISPOSITION OF SPECIMEN:  N/A  COUNTS:  YES  TOURNIQUET:  * No tourniquets in log *  DICTATION: .Dragon Dictation After informed consent was obtained the patient was brought to the operating room and placed in the supine position on the operating table. After adequate induction of general anesthesia the patient's abdomen was prepped with ChloraPrep, wet-to-dry, and draped in usual sterile manner. This included the use of an Ioban drape. Site was chosen on the left upper quadrant for placement of a 5 mm port. This area was infiltrated with quarter percent Marcaine. A small stab incision was made with a 15 blade knife. A 5 mm Optiview port with camera was used to bluntly dissect through the layers of the abdominal wall under direct vision until access was gained to the abdominal cavity. The abdomen was then insufflated with carbon dioxide without difficulty. The abdomen was inspected. There was a small hernia defect near the falciform ligament. There was also some laxity of the abdominal wall at the umbilicus. A 10 x 15 piece of ventral light mesh was chosen so that it could cover both areas. The falciform ligament was taken down sharply with the harmonic scalpel. 6 #1 Novafil touches were then placed at equidistant points around the edge of the mesh. Next a small incision was made through the middle of the hernia defect with a 15 blade knife and  cautery. The hernia sac was removed sharply with the electrocautery. The mesh was then placed through the fascial defect in the appropriate orientation and then the fascial defect was closed with interrupted #1 Novafil stitches. The abdomen was then insufflated again. 6 small stab incisions were made at points that corresponded to those stitches. A suture passer was then used to bring the tails of each stitch through the appropriate opening and once this was accomplished the stitches were cinched down and tied. Once this was accomplished the mesh was in good apposition to the abdominal wall. There was a small amount of bleeding from an area on the right lower edge of the mesh and this was controlled by passing a 0 Vicryl tie around that area and cinching it down tight. The gaps between the stitches were filled in with a secure strap tacker. Once this was accomplished then the mesh was still in good apposition to the abdominal wall without any gaps or folds. The air was examined and found to be hemostatic. The gas was then allowed to escape under direct vision and the mesh was visualized to continue to be in good apposition to the abdominal wall. The incisions were then all closed with interrupted 4-0 Monocryl subcuticular stitches. Dermabond dressings were applied. The patient tolerated the procedure well. At the end of the case all needle sponge and instrument counts were correct. The patient was then awakened and taken to recovery in stable condition. An abdominal binder was applied as well.  PLAN OF  CARE: Admit to inpatient   PATIENT DISPOSITION:  PACU - hemodynamically stable.   Delay start of Pharmacological VTE agent (>24hrs) due to surgical blood loss or risk of bleeding: yes

## 2013-12-14 NOTE — H&P (Signed)
Samantha DeutscherJennifer L Defibaugh  09/01/2013 9:10 AM   Office Visit  MRN:  [id number]   Description: 48 year old female  Provider: Robyne AskewPaul S Toth III, MD  Department: [facility]          Diagnoses      Umbilical pain    -  Primary      789.05             Reason for Visit      New Evaluation      eval RIH               Current Vitals - Last Recorded      BP Pulse Temp(Src) Resp Ht Wt      126/82 60 98.2 F (36.8 C) (Temporal) 14 5\' 1"  (1.549 m) 219 lb 6.4 oz (99.519 kg)            BMI                41.48 kg/m2                        Progress Notes      Robyne AskewPaul S Toth III, MD at 09/23/2013  9:46 AM      Status: Signed            Patient ID: Samantha DeutscherJennifer L [name], female   DOB: 10-15-1966, 48 y.o.   MRN: [id number]    Chief Complaint   Patient presents with   .  New Evaluation       eval RIH        HPI Samantha DeutscherJennifer L Kaster is a 48 y.o. female.  We are asked to see the pt in consultation by Dr. Hyacinth MeekerMiller to evaluate her for a ventral hernia. The pt is a 48 yo female who has been experiencing pain near her umbilicus for the last month. She notices it mostly when she leans over a washer. She denies any nausea or vomiting. She does have problems with constipation.  HPI    Past Medical History   Diagnosis  Date   .  GERD (gastroesophageal reflux disease)     .  Heart murmur     .  Hyperlipidemia     .  Hypertension           Past Surgical History   Procedure  Laterality  Date   .  Cholecystectomy             Family History   Problem  Relation  Age of Onset   .  Cancer  Mother         ovarian        Social History History   Substance Use Topics   .  Smoking status:  Never Smoker    .  Smokeless tobacco:  Never Used   .  Alcohol Use:  No         Allergies   Allergen  Reactions   .  Latex  Hives         Current Outpatient Prescriptions   Medication  Sig  Dispense  Refill   .  cetirizine (ZYRTEC) 10 MG tablet  Take 10 mg by mouth daily.         .   fish oil-omega-3 fatty acids 1000 MG capsule  Take 2 g by mouth daily.         Marland Kitchen.  JOLIVETTE 0.35 MG tablet           .  LYRICA 100 MG capsule           .  omeprazole (PRILOSEC) 40 MG capsule               No current facility-administered medications for this visit.        Review of Systems Review of Systems  Constitutional: Negative.   HENT: Negative.   Eyes: Negative.   Respiratory: Negative.   Cardiovascular: Negative.   Gastrointestinal: Positive for abdominal pain and constipation. Negative for vomiting and abdominal distention.  Endocrine: Negative.   Genitourinary: Negative.   Musculoskeletal: Negative.   Skin: Negative.   Allergic/Immunologic: Negative.   Neurological: Negative.   Hematological: Negative.   Psychiatric/Behavioral: Negative.       Blood pressure 126/82, pulse 60, temperature 98.2 F (36.8 C), temperature source Temporal, resp. rate 14, height 5\' 1"  (1.549 m), weight 219 lb 6.4 oz (99.519 kg).   Physical Exam Physical Exam  Constitutional: She is oriented to person, place, and time. She appears well-developed and well-nourished.  HENT:   Head: Normocephalic and atraumatic.  Eyes: Conjunctivae and EOM are normal. Pupils are equal, round, and reactive to light.  Neck: Normal range of motion. Neck supple.  Cardiovascular: Normal rate, regular rhythm and normal heart sounds.   Pulmonary/Chest: Effort normal and breath sounds normal.  Abdominal: Soft. Bowel sounds are normal.  She does have tenderness centrally. I do not feel a definite fascial defect  Musculoskeletal: Normal range of motion.  Neurological: She is alert and oriented to person, place, and time.  Skin: Skin is warm and dry.  Psychiatric: She has a normal mood and affect. Her behavior is normal.      Data Reviewed As above   Assessment    The pt has abdominal pain but it is difficult to appreciate a fascial defect that would be indicative of a hernia. Because of this I would  recommend getting a CT of the abdomen and pelvis to look for evidence of a hernia that could be causing her pain      Plan    Plan for CT abd/pelvis. We will call her with the results of the study and then proceed accordingly  The pt has a ventral hernia. Because of the risk of incarceration and strangulation I think she would benefit from having it fixed. I have discussed with her the risks and benefits of surgery as well as some of the technical aspects and she understands and wishes to proceed.

## 2013-12-14 NOTE — Transfer of Care (Signed)
Immediate Anesthesia Transfer of Care Note  Patient: Samantha [name]  Procedure(s) Performed: Procedure(s): LAPAROSCOPIC VENTRAL HERNIA (N/A) INSERTION OF MESH (N/A)  Patient Location: PACU  Anesthesia Type:General  Level of Consciousness: awake, alert , oriented and patient cooperative  Airway & Oxygen Therapy: Patient Spontanous Breathing and Patient connected to face mask oxygen  Post-op Assessment: Report given to PACU RN, Post -op Vital signs reviewed and stable and Patient moving all extremities  Post vital signs: Reviewed and stable  Complications: No apparent anesthesia complications

## 2013-12-14 NOTE — Anesthesia Preprocedure Evaluation (Addendum)
Anesthesia Evaluation  Patient identified by MRN, date of birth, ID band Patient awake    Reviewed: Allergy & Precautions, H&P , NPO status , Patient's Chart, lab work & pertinent test results  History of Anesthesia Complications Negative for: history of anesthetic complications  Airway Mallampati: II TM Distance: >3 FB Neck ROM: Full    Dental  (+) Teeth Intact and Dental Advisory Given   Pulmonary sleep apnea and Continuous Positive Airway Pressure Ventilation ,    Pulmonary exam normal       Cardiovascular hypertension, Pt. on medications     Neuro/Psych negative neurological ROS  negative psych ROS   GI/Hepatic Neg liver ROS, hiatal hernia, GERD-  Medicated,  Endo/Other  diabetes, Oral Hypoglycemic AgentsMorbid obesity  Renal/GU negative Renal ROS     Musculoskeletal   Abdominal   Peds  Hematology   Anesthesia Other Findings   Reproductive/Obstetrics                          Anesthesia Physical Anesthesia Plan  ASA: III  Anesthesia Plan: General   Post-op Pain Management:    Induction: Intravenous  Airway Management Planned: Oral ETT  Additional Equipment:   Intra-op Plan:   Post-operative Plan: Extubation in OR  Informed Consent: I have reviewed the patients History and Physical, chart, labs and discussed the procedure including the risks, benefits and alternatives for the proposed anesthesia with the patient or authorized representative who has indicated his/her understanding and acceptance.   Dental advisory given  Plan Discussed with: CRNA, Anesthesiologist and Surgeon  Anesthesia Plan Comments:        Anesthesia Quick Evaluation

## 2013-12-14 NOTE — Anesthesia Procedure Notes (Signed)
Procedure Name: Intubation Date/Time: 12/14/2013 7:32 AM Performed by: Jerilee HohMUMM, Kale Rondeau N Pre-anesthesia Checklist: Patient identified, Emergency Drugs available, Suction available and Patient being monitored Patient Re-evaluated:Patient Re-evaluated prior to inductionOxygen Delivery Method: Circle system utilized Preoxygenation: Pre-oxygenation with 100% oxygen Intubation Type: IV induction Ventilation: Mask ventilation without difficulty Laryngoscope Size: Mac and 3 Grade View: Grade I Tube type: Oral Tube size: 7.5 mm Number of attempts: 1 Airway Equipment and Method: Stylet Placement Confirmation: ETT inserted through vocal cords under direct vision,  positive ETCO2 and breath sounds checked- equal and bilateral Secured at: 22 cm Tube secured with: Tape Dental Injury: Teeth and Oropharynx as per pre-operative assessment

## 2013-12-15 LAB — BASIC METABOLIC PANEL
BUN: 5 mg/dL — ABNORMAL LOW (ref 6–23)
CO2: 29 mEq/L (ref 19–32)
Calcium: 8.5 mg/dL (ref 8.4–10.5)
Chloride: 96 mEq/L (ref 96–112)
Creatinine, Ser: 0.68 mg/dL (ref 0.50–1.10)
GFR calc Af Amer: >90 mL/min (ref >90)
GFR calc non Af Amer: >90 mL/min (ref >90)
Glucose, Bld: 127 mg/dL — ABNORMAL HIGH (ref 70–99)
Potassium: 3 mEq/L — ABNORMAL LOW (ref 3.7–5.3)
Sodium: 139 mEq/L (ref 137–147)

## 2013-12-15 LAB — CBC
HCT: 38.3 % (ref 36.0–46.0)
Hemoglobin: 12.9 g/dL (ref 12.0–15.0)
MCH: 27.2 pg (ref 26.0–34.0)
MCHC: 33.7 g/dL (ref 30.0–36.0)
MCV: 80.8 fL (ref 78.0–100.0)
Platelets: 303 10*3/uL (ref 150–400)
RBC: 4.74 MIL/uL (ref 3.87–5.11)
RDW: 15 % (ref 11.5–15.5)
WBC: 11.6 10*3/uL — ABNORMAL HIGH (ref 4.0–10.5)

## 2013-12-15 MED ORDER — THYROID 81.25 MG PO TABS
81.2500 mg | ORAL_TABLET | Freq: Every day | ORAL | Status: DC
Start: 2013-12-16 — End: 2013-12-17
  Administered 2013-12-16: 81.25 mg via ORAL
  Filled 2013-12-15: qty 1

## 2013-12-15 NOTE — Anesthesia Postprocedure Evaluation (Signed)
  Anesthesia Post-op Note  Patient: Samantha [name]  Procedure(s) Performed: Procedure(s): LAPAROSCOPIC VENTRAL HERNIA (N/A) INSERTION OF MESH (N/A)  Patient Location: Nursing Unit  Anesthesia Type:General  Level of Consciousness: awake, alert  and oriented  Airway and Oxygen Therapy: Patient Spontanous Breathing  Post-op Pain: mild  Post-op Assessment: Post-op Vital signs reviewed, Patient's Cardiovascular Status Stable and Respiratory Function Stable  Post-op Vital Signs: Reviewed and stable  Complications: No apparent anesthesia complications

## 2013-12-15 NOTE — Progress Notes (Signed)
Pt. Ambulated 103000ft in hallway.  Tolerated well. Will continue to monitor. Vanice Sarahhompson, Hugh Garrow L

## 2013-12-15 NOTE — Progress Notes (Signed)
1 Day Post-Op  Subjective: No complaints other than some soreness. No nausea but no flatus yet  Objective: Vital signs in last 24 hours: Temp:  [97.6 F (36.4 C)-98.9 F (37.2 C)] 98.1 F (36.7 C) (02/10 0611) Pulse Rate:  [55-98] 76 (02/10 0611) Resp:  [13-24] 20 (02/10 0611) BP: (116-147)/(61-83) 144/71 mmHg (02/10 0611) SpO2:  [92 %-100 %] 92 % (02/10 0611) Last BM Date: 12/13/13  Intake/Output from previous day: 02/09 0701 - 02/10 0700 In: 3037.5 [P.O.:580; I.V.:2457.5] Out: 2350 [Urine:2300; Blood:50] Intake/Output this shift:    Resp: clear to auscultation bilaterally Cardio: regular rate and rhythm GI: soft, appropriately tender.   Lab Results:   Recent Labs  12/15/13 0318  WBC 11.6*  HGB 12.9  HCT 38.3  PLT 303   BMET  Recent Labs  12/15/13 0318  NA 139  K 3.0*  CL 96  CO2 29  GLUCOSE 127*  BUN 5*  CREATININE 0.68  CALCIUM 8.5   PT/INR No results found for this basename: LABPROT, INR,  in the last 72 hours ABG No results found for this basename: PHART, PCO2, PO2, HCO3,  in the last 72 hours  Studies/Results: No results found.  Anti-infectives: Anti-infectives   Start     Dose/Rate Route Frequency Ordered Stop   12/14/13 0600  ceFAZolin (ANCEF) IVPB 2 g/50 mL premix  Status:  Discontinued     2 g 100 mL/hr over 30 Minutes Intravenous On call to O.R. 12/13/13 1425 12/14/13 1132      Assessment/Plan: s/p Procedure(s): LAPAROSCOPIC VENTRAL HERNIA (N/A) INSERTION OF MESH (N/A) Advance diet. Wait for flatus ambulate  LOS: 1 day    TOTH [name],Samantha Revere S 12/15/2013

## 2013-12-16 MED ORDER — METOCLOPRAMIDE HCL 5 MG/ML IJ SOLN
5.0000 mg | Freq: Three times a day (TID) | INTRAMUSCULAR | Status: DC
  Administered 2013-12-16 – 2013-12-17 (×3): 5 mg via INTRAVENOUS
  Filled 2013-12-16 (×2): qty 1
  Filled 2013-12-16 (×2): qty 2
  Filled 2013-12-16: qty 1
  Filled 2013-12-16: qty 2

## 2013-12-16 NOTE — Progress Notes (Signed)
2 Days Post-Op  Subjective: Complains of abdominal soreness but otherwise seems to be ok  Objective: Vital signs in last 24 hours: Temp:  [98.3 F (36.8 C)-99 F (37.2 C)] 99 F (37.2 C) (02/11 0506) Pulse Rate:  [70-86] 86 (02/11 0506) Resp:  [19-20] 19 (02/11 0506) BP: (124-149)/(68-84) 149/84 mmHg (02/11 0506) SpO2:  [90 %-97 %] 92 % (02/11 0506) Last BM Date: 12/13/13  Intake/Output from previous day: 02/10 0701 - 02/11 0700 In: 2117 [P.O.:360; I.V.:1757] Out: 3750 [Urine:3750] Intake/Output this shift:    Resp: clear to auscultation bilaterally Cardio: regular rate and rhythm GI: soft, appropriately tender. quiet  Lab Results:   Recent Labs  12/15/13 0318  WBC 11.6*  HGB 12.9  HCT 38.3  PLT 303   BMET  Recent Labs  12/15/13 0318  NA 139  K 3.0*  CL 96  CO2 29  GLUCOSE 127*  BUN 5*  CREATININE 0.68  CALCIUM 8.5   PT/INR No results found for this basename: LABPROT, INR,  in the last 72 hours ABG No results found for this basename: PHART, PCO2, PO2, HCO3,  in the last 72 hours  Studies/Results: No results found.  Anti-infectives: Anti-infectives   Start     Dose/Rate Route Frequency Ordered Stop   12/14/13 0600  ceFAZolin (ANCEF) IVPB 2 g/50 mL premix  Status:  Discontinued     2 g 100 mL/hr over 30 Minutes Intravenous On call to O.R. 12/13/13 1425 12/14/13 1132      Assessment/Plan: s/p Procedure(s): LAPAROSCOPIC VENTRAL HERNIA (N/A) INSERTION OF MESH (N/A) continue clears until bowel function returns Add reglan for ileus ambulate  LOS: 2 days    TOTH III,PAUL S 12/16/2013

## 2013-12-17 ENCOUNTER — Encounter (HOSPITAL_COMMUNITY): Payer: Self-pay | Admitting: General Surgery

## 2013-12-17 NOTE — Discharge Summary (Signed)
Physician Discharge Summary  Patient ID: Samantha [name] MRN: [id number] DOB/AGE: [birth year] 48 y.o.  Admit date: 12/14/2013 Discharge date: 12/17/2013  Admission Diagnoses:  Discharge Diagnoses:  Active Problems:   Ventral hernia   Discharged Condition: good  Hospital Course: the pt underwent lap ventral hernia repair with mesh. She had an ileus post op which gradually resolved. She is now ready for discharge home. She has some slight redness over lower abdomen so i will send her home on doxycycline  Consults: None  Significant Diagnostic Studies: none  Treatments: surgery: as above  Discharge Exam: Blood pressure 135/81, pulse 76, temperature 98.5 F (36.9 C), temperature source Oral, resp. rate 20, last menstrual period 07/14/2013, SpO2 99.00%. GI: soft. good bs. slight redness of lower abdomen  Disposition: 01-Home or Self Care  Discharge Orders   Future Appointments Provider Department Dept Phone   12/30/2013 4:50 PM Robyne AskewPaul S Toth III, MD [facility] [place] [phone]   01/29/2014 3:30 PM [facility] - [id number] [place] [phone]   Please arrive 15 minutes prior to your appointment time. Any medications can be taken as usual.   01/29/2014 4:00 PM [facility] [place] [facility] [phone]   Please wear two piece clothing and wear no powder or deodorant. Please arrive 15 minutes early prior to your appointment time.   12/10/2014 3:45 PM Lauro FranklinPatricia Rolen-Grubb, FNP [facility] [phone]   Future Orders Complete By Expires   Call MD for:  difficulty breathing, headache or visual disturbances  As directed    Call MD for:  extreme fatigue  As directed    Call MD for:  hives  As directed    Call MD for:  persistant dizziness or light-headedness  As directed    Call MD for:  persistant nausea and vomiting  As directed    Call MD for:  redness, tenderness, or signs of infection (pain, swelling, redness,  odor or green/yellow discharge around incision site)  As directed    Call MD for:  severe uncontrolled pain  As directed    Call MD for:  temperature >100.4  As directed    Diet - low sodium heart healthy  As directed    Discharge instructions  As directed    Comments:     May shower. No heavy lifting. Diet as tolerated. Wear binder only if comfortable   Increase activity slowly  As directed    No wound care  As directed        Medication List         b complex vitamins tablet  Take 1 tablet by mouth daily.     CALTRATE 600 PO  Take 600 mg by mouth daily.     cetirizine 10 MG tablet  Commonly known as:  ZYRTEC  Take 10 mg by mouth daily.     hydrochlorothiazide 25 MG tablet  Commonly known as:  HYDRODIURIL  Take 25 mg by mouth daily.     JOLIVETTE 0.35 MG tablet  Generic drug:  norethindrone  Take 1 tablet by mouth daily.     metformin 1000 MG (OSM) 24 hr tablet  Commonly known as:  FORTAMET  Take 1,000 mg by mouth daily with breakfast.     NATURE-THROID 81.25 MG Tabs  Generic drug:  Thyroid  Take 81.25 mg by mouth daily.     omeprazole 40 MG capsule  Commonly known as:  PRILOSEC  Take 40 mg by mouth daily.  pregabalin 100 MG capsule  Commonly known as:  LYRICA  Take 200 mg by mouth at bedtime.           Follow-up Information   Follow up with Robyne Askew, MD In 1 week.   Specialty:  General Surgery   Contact information:   [address] [place] [address] [phone]       Signed: Robyne Askew 12/17/2013, 9:00 AM

## 2013-12-17 NOTE — Progress Notes (Signed)
3 Days Post-Op  Subjective: She feels better today and is ready to go home  Objective: Vital signs in last 24 hours: Temp:  [98.3 F (36.8 C)-98.5 F (36.9 C)] 98.5 F (36.9 C) (02/12 0518) Pulse Rate:  [76-97] 76 (02/12 0518) Resp:  [17-20] 20 (02/12 0518) BP: (135-153)/(75-89) 135/81 mmHg (02/12 0518) SpO2:  [98 %-100 %] 99 % (02/12 0518) Last BM Date: 12/17/13  Intake/Output from previous day: 02/11 0701 - 02/12 0700 In: 2149 [P.O.:240; I.V.:1909] Out: 1100 [Urine:1100] Intake/Output this shift:    Resp: clear to auscultation bilaterally Cardio: regular rate and rhythm GI: soft, mild tenderness. good bs. there is some faint redness over lower abdomen. no fever  Lab Results:   Recent Labs  12/15/13 0318  WBC 11.6*  HGB 12.9  HCT 38.3  PLT 303   BMET  Recent Labs  12/15/13 0318  NA 139  K 3.0*  CL 96  CO2 29  GLUCOSE 127*  BUN 5*  CREATININE 0.68  CALCIUM 8.5   PT/INR No results found for this basename: LABPROT, INR,  in the last 72 hours ABG No results found for this basename: PHART, PCO2, PO2, HCO3,  in the last 72 hours  Studies/Results: No results found.  Anti-infectives: Anti-infectives   Start     Dose/Rate Route Frequency Ordered Stop   12/14/13 0600  ceFAZolin (ANCEF) IVPB 2 g/50 mL premix  Status:  Discontinued     2 g 100 mL/hr over 30 Minutes Intravenous On call to O.R. 12/13/13 1425 12/14/13 1132      Assessment/Plan: s/p Procedure(s): LAPAROSCOPIC VENTRAL HERNIA (N/A) INSERTION OF MESH (N/A) Discharge Will send home on several days of doxy for the redness and see her back next week  LOS: 3 days    TOTH III,Sereen Schaff S 12/17/2013

## 2013-12-17 NOTE — Progress Notes (Signed)
Discharge home. Home discharge instruction given, no question verbalized. 

## 2013-12-23 ENCOUNTER — Encounter (INDEPENDENT_AMBULATORY_CARE_PROVIDER_SITE_OTHER): Payer: Federal, State, Local not specified - PPO | Admitting: General Surgery

## 2013-12-25 ENCOUNTER — Ambulatory Visit (INDEPENDENT_AMBULATORY_CARE_PROVIDER_SITE_OTHER): Payer: Federal, State, Local not specified - PPO | Admitting: General Surgery

## 2013-12-25 ENCOUNTER — Encounter (INDEPENDENT_AMBULATORY_CARE_PROVIDER_SITE_OTHER): Payer: Self-pay | Admitting: General Surgery

## 2013-12-25 VITALS — BP 128/80 | HR 72 | Temp 98.1°F | Resp 14 | Ht 61.0 in | Wt 213.2 lb

## 2013-12-25 DIAGNOSIS — K439 Ventral hernia without obstruction or gangrene: Secondary | ICD-10-CM

## 2013-12-25 MED ORDER — METHOCARBAMOL 500 MG PO TABS: 500.0000 mg | ORAL_TABLET | Freq: Three times a day (TID) | ORAL | Status: DC | PRN

## 2013-12-25 NOTE — Progress Notes (Signed)
Subjective:     Patient ID: Samantha [name], female   DOB: 1966-10-14, 48 y.o.   MRN: [id number]  HPI The patient is a 48 year old white female who is about 2 weeks status post laparoscopic ventral hernia repair with mesh. She is still having significant tenderness along her abdominal wall. She denies any fevers or chills. Her appetite is good and her bowels are working normally.  Review of Systems     Objective:   Physical Exam On exam her abdomen is soft with appropriate tenderness around the incisions. Her incisions are all healing nicely with no sign of infection. Her abdominal wall feels very solid with no palpable evidence of recurrence of the hernia.    Assessment:     The patient is 2 weeks status post laparoscopic ventral hernia repair with mesh     Plan:     At this point she should continue to refrain from any heavy lifting. She may walk and shower. I will plan to see her back in one month to check her progress. I will also start her on a muscle relaxer to see if this helps.

## 2013-12-25 NOTE — Patient Instructions (Signed)
No heavy lifting May shower 

## 2013-12-30 ENCOUNTER — Encounter (INDEPENDENT_AMBULATORY_CARE_PROVIDER_SITE_OTHER): Payer: Federal, State, Local not specified - PPO | Admitting: General Surgery

## 2014-01-13 ENCOUNTER — Telehealth (INDEPENDENT_AMBULATORY_CARE_PROVIDER_SITE_OTHER): Payer: Self-pay | Admitting: *Deleted

## 2014-01-13 NOTE — Telephone Encounter (Signed)
Patient called to report lower left abdominal pain that radiates up the left flank.  Patient states this pain is worse in the morning and actually wakes her from sleep.  Patient states that standing up and ambulating improves the pain.  Patient also describes a "flutter" in the naval area that occurs with this pain.  Patient states she has tried pain medication and a muscle relaxant without relief.  Patient states normal bowel movements and able to empty her bladder.  Patient denies fever or signs of infection.  Patient states she is eating without difficulty and denies n/v.  Explained that I would send a message to Dr. Carolynne Edouardoth, patient is scheduled to see Dr. Carolynne Edouardoth on Monday, 01/18/14.  I did advise patient however that I will also run her symptoms past the urgent office MD today just to make sure he doesn't feel anything is urgent.  Explained that we will give her a call back to let her know of any suggestions that are made.  Patient states understanding and agreeable at this time.

## 2014-01-15 NOTE — Telephone Encounter (Signed)
Sounds like it is probably postop pain. Will see her next week

## 2014-01-15 NOTE — Telephone Encounter (Signed)
LM> refer to msg below

## 2014-01-18 ENCOUNTER — Encounter (INDEPENDENT_AMBULATORY_CARE_PROVIDER_SITE_OTHER): Payer: Self-pay | Admitting: General Surgery

## 2014-01-18 ENCOUNTER — Encounter (INDEPENDENT_AMBULATORY_CARE_PROVIDER_SITE_OTHER): Payer: Self-pay

## 2014-01-18 ENCOUNTER — Ambulatory Visit (INDEPENDENT_AMBULATORY_CARE_PROVIDER_SITE_OTHER): Payer: Federal, State, Local not specified - PPO | Admitting: General Surgery

## 2014-01-18 VITALS — BP 126/80 | HR 75 | Temp 98.8°F | Resp 14 | Ht 61.0 in | Wt 210.0 lb

## 2014-01-18 DIAGNOSIS — K439 Ventral hernia without obstruction or gangrene: Secondary | ICD-10-CM

## 2014-01-18 NOTE — Patient Instructions (Signed)
May return to work and start more activity next week

## 2014-01-28 DIAGNOSIS — I10 Essential (primary) hypertension: Secondary | ICD-10-CM | POA: Insufficient documentation

## 2014-01-28 DIAGNOSIS — E119 Type 2 diabetes mellitus without complications: Secondary | ICD-10-CM | POA: Insufficient documentation

## 2014-01-28 DIAGNOSIS — E039 Hypothyroidism, unspecified: Secondary | ICD-10-CM | POA: Insufficient documentation

## 2014-01-29 ENCOUNTER — Ambulatory Visit
Admission: RE | Admit: 2014-01-29 | Discharge: 2014-01-29 | Disposition: A | Discharge status: Home / Self Care | Payer: Self-pay | Source: Ambulatory Visit

## 2014-01-29 ENCOUNTER — Ambulatory Visit
Admission: RE | Admit: 2014-01-29 | Discharge: 2014-01-29 | Disposition: A | Discharge status: Home / Self Care | Payer: Self-pay | Source: Ambulatory Visit | Attending: Nurse Practitioner | Admitting: Nurse Practitioner

## 2014-01-29 DIAGNOSIS — Z1231 Encounter for screening mammogram for malignant neoplasm of breast: Secondary | ICD-10-CM

## 2014-01-29 DIAGNOSIS — M8580 Other specified disorders of bone density and structure, unspecified site: Secondary | ICD-10-CM

## 2014-02-05 ENCOUNTER — Other Ambulatory Visit: Payer: Self-pay | Admitting: Nurse Practitioner

## 2014-02-08 NOTE — Progress Notes (Signed)
Subjective:     Patient ID: Samantha [name], female   DOB: 24-Apr-1966, 48 y.o.   MRN: [id number]  HPI The patient is a 48 year old female who is about 6 weeks status post laparoscopic ventral hernia repair with mesh. She continues to have some abdominal soreness. Her appetite is good and her bowels are working normally. The soreness is gradually improving.  Review of Systems     Objective:   Physical Exam On exam her abdomen is soft with minimal tenderness. Her incisions are all healing nicely with no sign of infection. Her abdominal wall feels very solid with no palpable evidence of recurrence of the hernia    Assessment:     The patient is 6 weeks status post laparoscopic ventral hernia repair with mesh     Plan:     At this point she may begin returning to her normal activities without restriction. We will plan to see her back in another couple months to check her progress

## 2014-02-09 ENCOUNTER — Other Ambulatory Visit: Payer: Self-pay | Admitting: Nurse Practitioner

## 2014-02-09 MED ORDER — NORETHINDRONE 0.35 MG PO TABS: ORAL_TABLET | ORAL | Status: DC

## 2014-02-09 NOTE — Telephone Encounter (Addendum)
Last refilled: 11/27/13 #90/0 refills was sent to [facility]   Last AEX: 11/27/13  AEX schedule for 12/11/14  According to the AEX note rx was supposed to be filled x1 year   J[place]ette #90/2 refills sent to pharmacy to last patient.  Please Advise.

## 2014-02-09 NOTE — Telephone Encounter (Signed)
It looked like when I went in to place order that she still had 2 refills of 3 months each - so I just refilled it again.

## 2014-03-22 ENCOUNTER — Encounter (INDEPENDENT_AMBULATORY_CARE_PROVIDER_SITE_OTHER): Payer: Federal, State, Local not specified - PPO | Admitting: General Surgery

## 2014-04-29 ENCOUNTER — Ambulatory Visit (INDEPENDENT_AMBULATORY_CARE_PROVIDER_SITE_OTHER): Payer: Federal, State, Local not specified - PPO | Admitting: General Surgery

## 2014-04-29 DIAGNOSIS — K439 Ventral hernia without obstruction or gangrene: Secondary | ICD-10-CM

## 2014-04-29 NOTE — Patient Instructions (Signed)
May return to normal activities 

## 2014-04-30 ENCOUNTER — Encounter (INDEPENDENT_AMBULATORY_CARE_PROVIDER_SITE_OTHER): Payer: Self-pay | Admitting: General Surgery

## 2014-04-30 NOTE — Progress Notes (Signed)
Subjective:     Patient ID: Samantha DeutscherJennifer L Timberlake, female   DOB: 01-06-1966, 48 y.o.   MRN: [id number]  HPI The patient is a 48 year old black female who is about 4 months status post laparoscopic ventral hernia repair with mesh. She is not having the pain that she was having previously. She does occasionally get some sharp shooting pains that don't last. Her appetite is good and her bowels are working normally.  Review of Systems     Objective:   Physical Exam On exam her abdomen is soft With minimal tenderness. Her incisions are healing nicely with no sign of infection. Her bowel wall feels very solid with no palpable evidence of recurrence of the hernia    Assessment:     The patient is 4 months status post laparoscopic ventral hernia repair with mesh     Plan:     At this point she may return to her normal activities without restriction. I will plan to see her back on a when necessary basis.

## 2014-05-06 DIAGNOSIS — Z6841 Body Mass Index (BMI) 40.0 and over, adult: Secondary | ICD-10-CM | POA: Insufficient documentation

## 2014-05-06 DIAGNOSIS — IMO0002 Reserved for concepts with insufficient information to code with codable children: Secondary | ICD-10-CM | POA: Insufficient documentation

## 2014-08-06 DIAGNOSIS — G64 Other disorders of peripheral nervous system: Secondary | ICD-10-CM | POA: Insufficient documentation

## 2014-08-25 DIAGNOSIS — E669 Obesity, unspecified: Secondary | ICD-10-CM | POA: Insufficient documentation

## 2014-09-02 ENCOUNTER — Other Ambulatory Visit: Payer: Self-pay

## 2014-09-02 DIAGNOSIS — Z1231 Encounter for screening mammogram for malignant neoplasm of breast: Secondary | ICD-10-CM

## 2014-09-06 ENCOUNTER — Encounter (INDEPENDENT_AMBULATORY_CARE_PROVIDER_SITE_OTHER): Payer: Self-pay | Admitting: General Surgery

## 2014-12-10 ENCOUNTER — Encounter: Payer: Self-pay | Admitting: Nurse Practitioner

## 2014-12-10 ENCOUNTER — Ambulatory Visit (INDEPENDENT_AMBULATORY_CARE_PROVIDER_SITE_OTHER): Payer: Federal, State, Local not specified - PPO | Admitting: Nurse Practitioner

## 2014-12-10 VITALS — BP 104/66 | HR 68 | Ht 61.0 in | Wt 213.0 lb

## 2014-12-10 DIAGNOSIS — Z0000 Encounter for general adult medical examination without abnormal findings: Secondary | ICD-10-CM

## 2014-12-10 DIAGNOSIS — Z01419 Encounter for gynecological examination (general) (routine) without abnormal findings: Secondary | ICD-10-CM

## 2014-12-10 LAB — POCT URINALYSIS DIPSTICK
Bilirubin, UA: NEGATIVE
Blood, UA: NEGATIVE
Glucose, UA: NEGATIVE
Ketones, UA: NEGATIVE
Leukocytes, UA: NEGATIVE
Nitrite, UA: NEGATIVE
Protein, UA: NEGATIVE
Urobilinogen, UA: NEGATIVE
pH, UA: 5

## 2014-12-10 MED ORDER — NORETHINDRONE 0.35 MG PO TABS: ORAL_TABLET | ORAL | Status: DC

## 2014-12-10 NOTE — Patient Instructions (Signed)

## 2014-12-10 NOTE — Progress Notes (Signed)
Patient ID: Samantha [name], female   DOB: 11/15/1965, 49 y.o.   MRN: [name] 49 y.o. G2P0011 Married  African American Fe here for annual exam.  Spotting only with wiping monthly.  No PMS or breast tenderness or bloating.  Some vaginal dryness.  S/P ventral hernia repair 12/14/13.   Patient's last menstrual period was 11/19/2014 (approximate).          Sexually active: Yes.    The current method of family planning is POP (progesterone only) and Essure on one side  Exercising: Yes.    Home exercise routine includes walking. Smoker:  no  Health Maintenance: Pap:  11/24/12, negative with neg HR HPV MMG:  01/29/14, Bi-Rads 1:  Negative (scheduled for 01/31/15) BMD:   01/29/14, Z-Score:  -0.9Spine/ -0.1Left Hip TDaP:   11/24/12 Labs: PCP  Urine:  Negative    reports that she has never smoked. She has never used smokeless tobacco. She reports that she does not drink alcohol or use illicit drugs.  Past Medical History  Diagnosis Date  . Heart murmur   . Hyperlipidemia     but no meds required   . Hypertension     takes HCTZ daily  . GERD (gastroesophageal reflux disease)     takes Omeprazole daily  . Constipation   . Diarrhea   . Hyperthyroidism     takes [employer]ill Lynchature Throid daily  . Diabetes mellitus without complication 09/2013    takes Metformin daily  . Peripheral neuropathy     takes Lyrica daily  . Dysphagia   . H/O hiatal hernia   . Sleep apnea     uses CPAP-study done in Dec 2014-to request report from [facility] in [place]    Past Surgical History  Procedure Laterality Date  . Cholecystectomy    . Essure tubal ligation  11/2006    one tube still open  . Tcs    . Esophagogastroduodenoscopy    . Ventral hernia repair N/A 12/14/2013    Procedure: LAPAROSCOPIC VENTRAL HERNIA;  Surgeon: Robyne AskewPaul S Toth III, MD;  Location: [facility] OR;  Service: General;  Laterality: N/A;  . Insertion of mesh N/A 12/14/2013    Procedure: INSERTION OF MESH;  Surgeon: Robyne AskewPaul S Toth III, MD;  Location: [facility]  OR;  Service: General;  Laterality: N/A;    Current Outpatient Prescriptions  Medication Sig Dispense Refill  . levothyroxine (SYNTHROID, LEVOTHROID) 50 MCG tablet Take 1 tablet by mouth daily before breakfast.  1  . b complex vitamins tablet Take 1 tablet by mouth daily.    . Calcium Carbonate (CALTRATE 600 PO) Take 600 mg by mouth daily.     . cetirizine (ZYRTEC) 10 MG tablet Take 10 mg by mouth daily.    . hydrochlorothiazide (HYDRODIURIL) 25 MG tablet Take 25 mg by mouth daily.     . metformin (FORTAMET) 1000 MG (OSM) 24 hr tablet Take 1,000 mg by mouth daily with breakfast.     . norethindrone (JOLIVETTE) 0.35 MG tablet TAKE 1 TABLET BY MOUTH EVERY DAY 3 Package 3  . omeprazole (PRILOSEC) 40 MG capsule Take 40 mg by mouth daily.    . pregabalin (LYRICA) 100 MG capsule Take 200 mg by mouth at bedtime.     No current facility-administered medications for this visit.    Family History  Problem Relation Age of Onset  . Multiple births Mother   . Ovarian cancer Mother   . Hypertension Mother   . Hypertension Father   . Heart disease  Father   . Heart attack Father   . Diabetes Brother   . Thyroid disease Paternal Aunt   . Thyroid disease Paternal Grandmother   . Osteoporosis Other   . Hypertension Brother   . Hypertension Sister     ROS:  Pertinent items are noted in HPI.  Otherwise, a comprehensive ROS was negative.  Exam:   BP 104/66 mmHg  Pulse 68  Ht  (1.549 m)  Wt 213 lb (96.616 kg)  BMI 40.27 kg/m2  LMP 11/19/2014 (Approximate) Height:  (154.9 cm) Ht Readings from Last 3 Encounters:  12/10/14  (1.549 m)  01/18/14  (1.549 m)  12/25/13  (1.549 m)    General appearance: alert, cooperative and appears stated age Head: Normocephalic, without obvious abnormality, atraumatic Neck: no adenopathy, supple, symmetrical, trachea midline and thyroid normal to inspection and palpation Lungs: clear to auscultation bilaterally Breasts: normal  appearance, no masses or tenderness Heart: regular rate and rhythm Abdomen: soft, non-tender; no masses,  no organomegaly Extremities: extremities normal, atraumatic, no cyanosis or edema Skin: Skin color, texture, turgor normal. No rashes or lesions Lymph nodes: Cervical, supraclavicular, and axillary nodes normal. No abnormal inguinal nodes palpated Neurologic: Grossly normal   Pelvic: External genitalia:  no lesions              Urethra:  normal appearing urethra with no masses, tenderness or lesions              Bartholin's and Skene's: normal                 Vagina: normal appearing vagina with normal color and discharge, no lesions              Cervix: anteverted              Pap taken: No. Bimanual Exam:  Uterus:  normal size, contour, position, consistency, mobility, non-tender              Adnexa: no mass, fullness, tenderness               Rectovaginal: Confirms               Anus:  normal sphincter tone, no lesions  Chaperone present:  no  A:  Well Woman with normal exam  Perimenopausal on POP since 11/2006 with light and irregular menses S/P Essure for birth control 11/2006 with one tube that is open  Vit D deficiency  Obesity, DM, HTN  Diabetic neuropathy - on Lyrica   P:   Reviewed health and wellness pertinent to exam  Pap smear not taken today  Mammogram is due 3/16  Refill on POP  Counseled on breast self exam, mammography screening, use and side effects of POP's, adequate intake of calcium and vitamin D, diet and exercise return annually or prn  An After Visit Summary was printed and given to the patient.

## 2014-12-12 NOTE — Progress Notes (Signed)
Encounter reviewed by Dr. Brook Silva.  

## 2015-01-31 ENCOUNTER — Ambulatory Visit
Admission: RE | Admit: 2015-01-31 | Discharge: 2015-01-31 | Disposition: A | Discharge status: Home / Self Care | Payer: Federal, State, Local not specified - PPO | Source: Ambulatory Visit

## 2015-01-31 DIAGNOSIS — Z1231 Encounter for screening mammogram for malignant neoplasm of breast: Secondary | ICD-10-CM

## 2015-02-25 DIAGNOSIS — E785 Hyperlipidemia, unspecified: Secondary | ICD-10-CM | POA: Insufficient documentation

## 2015-05-02 ENCOUNTER — Other Ambulatory Visit: Payer: Self-pay

## 2015-05-17 DIAGNOSIS — E041 Nontoxic single thyroid nodule: Secondary | ICD-10-CM | POA: Insufficient documentation

## 2015-05-31 ENCOUNTER — Other Ambulatory Visit: Payer: Self-pay | Admitting: Family Medicine

## 2015-05-31 DIAGNOSIS — E041 Nontoxic single thyroid nodule: Secondary | ICD-10-CM

## 2015-06-03 ENCOUNTER — Ambulatory Visit
Admission: RE | Admit: 2015-06-03 | Discharge: 2015-06-03 | Disposition: A | Discharge status: Home / Self Care | Payer: Federal, State, Local not specified - PPO | Source: Ambulatory Visit | Attending: Family Medicine | Admitting: Family Medicine

## 2015-06-03 DIAGNOSIS — E041 Nontoxic single thyroid nodule: Secondary | ICD-10-CM

## 2015-07-25 ENCOUNTER — Ambulatory Visit
Admission: RE | Admit: 2015-07-25 | Discharge: 2015-07-25 | Disposition: A | Discharge status: Home / Self Care | Payer: Federal, State, Local not specified - PPO | Source: Ambulatory Visit | Attending: Allergy and Immunology | Admitting: Allergy and Immunology

## 2015-07-25 ENCOUNTER — Other Ambulatory Visit: Payer: Self-pay | Admitting: Allergy and Immunology

## 2015-07-25 DIAGNOSIS — R05 Cough: Secondary | ICD-10-CM

## 2015-07-25 DIAGNOSIS — R059 Cough, unspecified: Secondary | ICD-10-CM

## 2015-08-01 DIAGNOSIS — J309 Allergic rhinitis, unspecified: Secondary | ICD-10-CM | POA: Insufficient documentation

## 2015-12-09 ENCOUNTER — Ambulatory Visit (INDEPENDENT_AMBULATORY_CARE_PROVIDER_SITE_OTHER): Payer: Federal, State, Local not specified - PPO | Admitting: Family Medicine

## 2015-12-09 VITALS — BP 142/80 | HR 80 | Temp 98.0°F | Resp 16 | Ht 61.0 in | Wt 231.0 lb

## 2015-12-09 DIAGNOSIS — H66002 Acute suppurative otitis media without spontaneous rupture of ear drum, left ear: Secondary | ICD-10-CM

## 2015-12-09 DIAGNOSIS — J039 Acute tonsillitis, unspecified: Secondary | ICD-10-CM

## 2015-12-09 DIAGNOSIS — J040 Acute laryngitis: Secondary | ICD-10-CM | POA: Diagnosis not present

## 2015-12-09 LAB — POCT RAPID STREP A (OFFICE): Rapid Strep A Screen: NEGATIVE

## 2015-12-09 MED ORDER — HYDROCOD POLST-CPM POLST ER 10-8 MG/5ML PO SUER: 5.0000 mL | Freq: Two times a day (BID) | ORAL | Status: DC | PRN

## 2015-12-09 MED ORDER — AMOXICILLIN-POT CLAVULANATE 875-125 MG PO TABS: 1.0000 | ORAL_TABLET | Freq: Two times a day (BID) | ORAL | Status: DC

## 2015-12-09 NOTE — Patient Instructions (Signed)

## 2015-12-09 NOTE — Progress Notes (Signed)
Subjective:    Patient ID: Samantha [name], female    DOB: 1966-04-14, 50 y.o.   MRN: [id number] By signing my name below, I, Javier Docker, attest that this documentation has been prepared under the direction and in the presence of Norberto Sorenson, MD. Electronically Signed: Javier Docker, ER Scribe. 12/09/2015. 6:21 PM.  Chief Complaint  Patient presents with  . Cough    x 2 months, pt. says coughs up yellowish color     HPI HPI Comments: Samantha [name] is a 50 y.o. female with a hx of DM who presents to [facility] complaining of a mild cough since December 13th. 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 She initially had a dry cough, but more recently her cough has been mildly productive. Her voice has been affected for the last three days. She denies rhinorrhea, sinus congestion or a past hx of asthma. She denies fever or chills. She has not used abx recently. She has NKDA. Her sleep has been disturbed by her cough.   Past Medical History  Diagnosis Date  . Heart murmur   . Hyperlipidemia     but no meds required   . Hypertension     takes HCTZ daily  . GERD (gastroesophageal reflux disease)     takes Omeprazole daily  . Constipation   . Diarrhea   . Hyperthyroidism     takes [employer] daily  . Diabetes mellitus without complication (HCC) 09/2013    takes Metformin daily  . Peripheral neuropathy (HCC)     takes Lyrica daily  . Dysphagia   . H/O hiatal hernia   . Sleep apnea     uses CPAP-study done in Dec 2014-to request report from [facility] in [place]  . Allergy    Allergies  Allergen Reactions  . Latex Hives   Current Outpatient Prescriptions on File Prior to Visit  Medication Sig Dispense Refill  . b complex vitamins tablet Take 1 tablet by mouth daily.    . Calcium Carbonate (CALTRATE 600 PO) Take 600 mg by mouth daily.     . cetirizine (ZYRTEC) 10 MG tablet Take 10 mg by mouth daily.    . hydrochlorothiazide (HYDRODIURIL) 25 MG tablet Take 25 mg by mouth  daily.     Marland Kitchen levothyroxine (SYNTHROID, LEVOTHROID) 50 MCG tablet Take 1 tablet by mouth daily before breakfast.  1  . metformin (FORTAMET) 1000 MG (OSM) 24 hr tablet Take 1,000 mg by mouth daily with breakfast.     . norethindrone (JOLIVETTE) 0.35 MG tablet TAKE 1 TABLET BY MOUTH EVERY DAY 3 Package 3  . omeprazole (PRILOSEC) 40 MG capsule Take 40 mg by mouth daily.    . pregabalin (LYRICA) 100 MG capsule Take 200 mg by mouth at bedtime.     No current facility-administered medications on file prior to visit.    Review of Systems  Constitutional: Negative for fever and chills.  HENT: Positive for congestion and ear pain. Negative for rhinorrhea and sinus pressure.   Respiratory: Positive for cough. Negative for shortness of breath and wheezing.   Gastrointestinal: Negative for nausea and vomiting.      Objective:  BP 142/80 mmHg  Pulse 80  Temp(Src) 98 F (36.7 C) (Oral)  Resp 16  Ht  (1.549 m)  Wt 231 lb (104.781 kg)  BMI 43.67 kg/m2  SpO2 95%  Physical Exam  Constitutional: She is oriented to person, place, and time. She appears well-developed and well-nourished. No distress.  HENT:  Head: Normocephalic and atraumatic.  Left eat with bright erythema and bulging. Right ear canal with erythema. Purulent erythema in left nare. 3+ tonsils with erythema and exudates.   Eyes: Pupils are equal, round, and reactive to light.  Neck: Neck supple.  Positive submandibular and tonsillar adenopathy.   Cardiovascular: Normal rate and regular rhythm.   Murmur heard. 2/6 systolic murmur.  Pulmonary/Chest: Effort normal and breath sounds normal. No respiratory distress.  Musculoskeletal: Normal range of motion.  Neurological: She is alert and oriented to person, place, and time. Coordination normal.  Skin: Skin is warm and dry. She is not diaphoretic.  Psychiatric: She has a normal mood and affect. Her behavior is normal.  Nursing note and vitals reviewed.     Results for orders  placed or performed in visit on 12/09/15  Culture, Group A Strep  Result Value Ref Range   Organism ID, Bacteria Normal Upper Respiratory Flora   POCT rapid strep A  Result Value Ref Range   Rapid Strep A Screen Negative Negative    Assessment & Plan:   1. Acute tonsillitis, unspecified etiology   2. Laryngitis   3. Acute suppurative otitis media of left ear without spontaneous rupture of tympanic membrane, recurrence not specified     Orders Placed This Encounter  Procedures  . Culture, Group A Strep    Order Specific Question:  Source    Answer:  throat  . POCT rapid strep A    Meds ordered this encounter  Medications  . amoxicillin-clavulanate (AUGMENTIN) 875-125 MG tablet    Sig: Take 1 tablet by mouth 2 (two) times daily.    Dispense:  20 tablet    Refill:  0  . chlorpheniramine-HYDROcodone (TUSSIONEX PENNKINETIC ER) 10-8 MG/5ML SUER    Sig: Take 5 mLs by mouth every 12 (twelve) hours as needed.    Dispense:  120 mL    Refill:  0    I personally performed the services described in this documentation, which was scribed in my presence. The recorded information has been reviewed and considered, and addended by me as needed.  Norberto Sorenson, MD MPH

## 2015-12-11 LAB — CULTURE, GROUP A STREP: Organism ID, Bacteria: NORMAL

## 2015-12-16 ENCOUNTER — Other Ambulatory Visit: Payer: Self-pay

## 2015-12-16 DIAGNOSIS — Z1231 Encounter for screening mammogram for malignant neoplasm of breast: Secondary | ICD-10-CM

## 2015-12-23 ENCOUNTER — Encounter: Payer: Self-pay | Admitting: Nurse Practitioner

## 2015-12-23 ENCOUNTER — Ambulatory Visit (INDEPENDENT_AMBULATORY_CARE_PROVIDER_SITE_OTHER): Payer: Federal, State, Local not specified - PPO | Admitting: Nurse Practitioner

## 2015-12-23 VITALS — BP 136/82 | HR 78 | Ht 61.25 in | Wt 230.0 lb

## 2015-12-23 DIAGNOSIS — Z0000 Encounter for general adult medical examination without abnormal findings: Secondary | ICD-10-CM

## 2015-12-23 DIAGNOSIS — Z01419 Encounter for gynecological examination (general) (routine) without abnormal findings: Secondary | ICD-10-CM

## 2015-12-23 DIAGNOSIS — J45901 Unspecified asthma with (acute) exacerbation: Secondary | ICD-10-CM | POA: Diagnosis not present

## 2015-12-23 MED ORDER — NORETHINDRONE 0.35 MG PO TABS: ORAL_TABLET | ORAL | Status: DC

## 2015-12-23 NOTE — Progress Notes (Signed)
Patient ID: Samantha [name], female   DOB: 21-Feb-1966, 50 y.o.   MRN: [id number]  50 y.o. G2P0011 Married  African American Fe here for annual exam.  Amenorrhea on POP since last January.   No PMS or cramps. No vaso symptoms.  She has had recent URI symptoms with the continued cough.  Symptoms started after a car repair with a lot of paint fumes that she had immediate respiratory symptoms.  She has been on course of 10 antibiotics and cough med's in addition to OTC Muccinex, Netty pot, Zyrtec.  She is unable to sleep secondary to cough.  Patient's last menstrual period was 11/19/2014 (approximate).          Sexually active: Yes.    The current method of family planning is tubal ligation.    Exercising: Yes.    Pt goes to gym 3-4 times per week Smoker:  no  Health Maintenance: Pap:  11/24/12, Negative  MMG:  01/31/15, 3D, Bi-Rads 1:  Negative BMD:  10/01/08, mild osteopenia in spine TDaP:  11/24/12 HIV: 11/06/95 with pregnancy pneumovax 11/24/2014 Labs: PCP, will have complete physical in April   reports that she has never smoked. She has never used smokeless tobacco. She reports that she does not drink alcohol or use illicit drugs.  Past Medical History  Diagnosis Date  . Heart murmur   . Hyperlipidemia     but no meds required   . Hypertension     takes HCTZ daily  . GERD (gastroesophageal reflux disease)     takes Omeprazole daily  . Constipation   . Diarrhea   . Hyperthyroidism     takes [employer] daily  . Diabetes mellitus without complication (HCC) 09/2013    takes Metformin daily  . Peripheral neuropathy (HCC)     takes Lyrica daily  . Dysphagia   . H/O hiatal hernia   . Sleep apnea     uses CPAP-study done in Dec 2014-to request report from [facility] in [place]  . Allergy     Past Surgical History  Procedure Laterality Date  . Cholecystectomy    . Essure tubal ligation  11/2006    one tube still open  . Tcs    . Esophagogastroduodenoscopy    . Ventral  hernia repair N/A 12/14/2013    Procedure: LAPAROSCOPIC VENTRAL HERNIA;  Surgeon: Robyne Askew, MD;  Location: [facility] OR;  Service: General;  Laterality: N/A;  . Insertion of mesh N/A 12/14/2013    Procedure: INSERTION OF MESH;  Surgeon: Robyne Askew, MD;  Location[facility] OR;  Service: General;  Laterality: N/A;    Current Outpatient Prescriptions  Medication Sig Dispense Refill  . b complex vitamins tablet Take 1 tablet by mouth daily.    . Calcium Carbonate (CALTRATE 600 PO) Take 600 mg by mouth daily.     . cetirizine (ZYRTEC) 10 MG tablet Take 10 mg by mouth daily.    Marland Kitchen levothyroxine (SYNTHROID, LEVOTHROID) 50 MCG tablet Take 1 tablet by mouth daily before breakfast.  1  . lisinopril-hydrochlorothiazide (PRINZIDE,ZESTORETIC) 20-25 MG tablet Take 1 tablet by mouth daily.  5  . LYRICA 150 MG capsule Take 1 capsule by mouth 2 (two) times daily.  5  . metFORMIN (GLUCOPHAGE) 500 MG tablet Take 1 tablet by mouth 2 (two) times daily.  3  . norethindrone (JOLIVETTE) 0.35 MG tablet TAKE 1 TABLET BY MOUTH EVERY DAY 3 Package 3  . omeprazole (PRILOSEC) 40 MG capsule Take 40  mg by mouth daily.    . pravastatin (PRAVACHOL) 80 MG tablet Take 1 tablet by mouth daily.  3   No current facility-administered medications for this visit.    Family History  Problem Relation Age of Onset  . Multiple births Mother   . Ovarian cancer Mother   . Hypertension Mother   . Hypertension Father   . Heart disease Father   . Heart attack Father   . Diabetes Brother   . Hypertension Brother   . Thyroid disease Paternal Aunt   . Thyroid disease Paternal Grandmother   . Osteoporosis Other   . Hypertension Brother   . Hypertension Sister     ROS:  Pertinent items are noted in HPI.  Otherwise, a comprehensive ROS was negative.  Exam:   BP 136/82 mmHg  Pulse 78  Ht 5' 1.25" (1.556 m)  Wt 230 lb (104.327 kg)  BMI 43.09 kg/m2  LMP 11/19/2014 (Approximate) Height: 5' 1.25" (155.6 cm) Ht Readings from Last 3  Encounters:  12/23/15 5' 1.25" (1.556 m)  12/09/15  (1.549 m)  12/10/14  (1.549 m)    General appearance: alert, cooperative and appears stated age Head: Normocephalic, without obvious abnormality, atraumatic Neck: no adenopathy, supple, symmetrical, trachea midline and thyroid normal to inspection and palpation Lungs: a very harsh raspy cough, no rales Breasts: normal appearance, no masses or tenderness Heart: regular rate and rhythm Abdomen: soft, non-tender; no masses,  no organomegaly Extremities: extremities normal, atraumatic, no cyanosis or edema Skin: Skin color, texture, turgor normal. No rashes or lesions Lymph nodes: Cervical, supraclavicular, and axillary nodes normal. No abnormal inguinal nodes palpated Neurologic: Grossly normal   Pelvic: External genitalia:  no lesions              Urethra:  normal appearing urethra with no masses, tenderness or lesions              Bartholin's and Skene's: normal                 Vagina: normal appearing vagina with normal color and discharge, no lesions              Cervix: anteverted              Pap taken: Yes.   Bimanual Exam:  Uterus:  normal size, contour, position, consistency, mobility, non-tender              Adnexa: no mass, fullness, tenderness               Rectovaginal: Confirms               Anus:  normal sphincter tone, no lesions  Chaperone present: yes  A:  Well Woman with normal exam  Perimenopausal on POP since 11/2006 with current amenorrhea S/P Essure for birth control 11/2006 with one tube that is open  Vit D deficiency Obesity, DM, HTN Diabetic neuropathy - on Lyrica  R/O Reactive airway infection  P:   Reviewed health and wellness pertinent to exam  Pap smear as above  Mammogram is due 01/2016  She will go back to Urgent Care and seek treatment for her cough - slip is given for a CXR if needed  Counseled on breast self exam, mammography  screening, use and side effects of POP's, adequate intake of calcium and vitamin D, diet and exercise return annually or prn  An After Visit Summary was printed and given to the patient.

## 2015-12-23 NOTE — Patient Instructions (Signed)

## 2015-12-25 NOTE — Progress Notes (Signed)
Encounter reviewed by Dr. Brook Amundson C. Silva.  

## 2015-12-26 ENCOUNTER — Other Ambulatory Visit: Payer: Self-pay | Admitting: Nurse Practitioner

## 2015-12-27 LAB — IPS PAP TEST WITH HPV

## 2015-12-29 ENCOUNTER — Other Ambulatory Visit: Payer: Self-pay

## 2015-12-29 DIAGNOSIS — B3731 Acute candidiasis of vulva and vagina: Secondary | ICD-10-CM

## 2015-12-29 DIAGNOSIS — B373 Candidiasis of vulva and vagina: Secondary | ICD-10-CM

## 2015-12-29 MED ORDER — TERCONAZOLE 0.4 % VA CREA: 1.0000 | TOPICAL_CREAM | Freq: Every day | VAGINAL | Status: DC

## 2016-01-26 ENCOUNTER — Ambulatory Visit (INDEPENDENT_AMBULATORY_CARE_PROVIDER_SITE_OTHER): Payer: Federal, State, Local not specified - PPO | Admitting: Pulmonary Disease

## 2016-01-26 ENCOUNTER — Encounter: Payer: Self-pay | Admitting: Pulmonary Disease

## 2016-01-26 VITALS — BP 126/76 | HR 87 | Ht 61.0 in | Wt 235.6 lb

## 2016-01-26 DIAGNOSIS — R05 Cough: Secondary | ICD-10-CM | POA: Diagnosis not present

## 2016-01-26 DIAGNOSIS — K219 Gastro-esophageal reflux disease without esophagitis: Secondary | ICD-10-CM | POA: Diagnosis not present

## 2016-01-26 DIAGNOSIS — J45909 Unspecified asthma, uncomplicated: Secondary | ICD-10-CM | POA: Insufficient documentation

## 2016-01-26 DIAGNOSIS — G4733 Obstructive sleep apnea (adult) (pediatric): Secondary | ICD-10-CM

## 2016-01-26 DIAGNOSIS — J4520 Mild intermittent asthma, uncomplicated: Secondary | ICD-10-CM

## 2016-01-26 DIAGNOSIS — R059 Cough, unspecified: Secondary | ICD-10-CM | POA: Insufficient documentation

## 2016-01-26 MED ORDER — BUDESONIDE-FORMOTEROL FUMARATE 160-4.5 MCG/ACT IN AERO: 2.0000 | INHALATION_SPRAY | Freq: Every day | RESPIRATORY_TRACT | Status: DC

## 2016-01-26 NOTE — Patient Instructions (Signed)
1. Call PCP regarding stopping lisinopril and switching to another medicine. 2. Start Symbicort, 2 puffs twice a day. Rinse mouth each time using it. 3. Continue omeprazole, bedtime. 4. Please don't forget to call your PCP regarding chest x-ray results.  Return to clinic in 3 mos.

## 2016-01-26 NOTE — Assessment & Plan Note (Addendum)
Patient is here for cough since December 2016. Initially started when she was exposed to her newly painted car. Cough is some better but is persistent. Differentials include the following: #1. She is on lisinopril. Told her to call her primary care doctor to have that switched to something else such as ARB. #2. Likely also related to reflux disease. Continue omeprazole, take it at bedtime. Dietary modifications. Head of bed 30. #3. Could also be hyperreactive airway disease since exposure to that pain. We'll start her on Symbicort, 160/4.5, 2 puffs twice a day. If she is better, hopefully we can wean this. #4. Told her to check her environment, workplace, home to see if there is something that she is allergic to causing her to cough. #5. Denies any sinus issues. #6Maryclare Labrador. We'll consider prednisone, Singulair, allergy meds if cough is persistent. #7. Chest x-ray done had no font in February 2017-- no acute changes. I am unable to visualize the x-ray.

## 2016-01-26 NOTE — Assessment & Plan Note (Signed)
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 Start Symbicort 160/4.5, 2 puffs twice a day.

## 2016-01-26 NOTE — Progress Notes (Signed)
Subjective:    Patient ID: Samantha [name], female    DOB: 11-Nov-1965, 50 y.o.   MRN: [id number]  HPI   This is the case of Samantha [name], 50 y.o. Female, who is being seen for cough.   As you very well know, patient's cough started in Dec 2016 after she was exposed to her newly painted car. It is as if, she was allergic to the paint. She was coughing, short of breath, wheezing. Slowly improved but the cough persisted. She saw her primary care doctor which prescribed her cough medicines. She ended up seeing allergy MD. who mentioned that she had reflux disease. She was started on omeprazole daily which made the cough a little better but it persisted.   No changes in environment. 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 No pets. No sick contacts. No new medicines or perfumes or colognes.  She coughs all day. Wakes up coughing at night as well. Not sure what triggers her cough. She uses a CPAP for sleep apnea and coughs despite that.  Cough overall is some better but she's annoyed because his persistent.    Review of Systems  Constitutional: Negative.  Negative for fever and unexpected weight change.       Gained 5 lbs since dec 2016  HENT: Positive for trouble swallowing. Negative for congestion, dental problem, ear pain, nosebleeds, postnasal drip, rhinorrhea, sinus pressure, sneezing and sore throat.   Eyes: Negative for redness and itching.  Respiratory: Positive for cough. Negative for shortness of breath and wheezing.   Cardiovascular: Negative for palpitations and leg swelling.  Gastrointestinal: Negative for nausea and vomiting.  Genitourinary: Negative for dysuria.  Musculoskeletal: Negative for joint swelling.  Skin: Negative for rash.  Neurological: Negative for headaches.  Hematological: Does not bruise/bleed easily.  Psychiatric/Behavioral: Negative for dysphoric mood. The patient is not nervous/anxious.   All other systems reviewed  and are negative.  Past Medical History  Diagnosis Date  . Heart murmur   . Hyperlipidemia     but no meds required   . Hypertension     takes HCTZ daily  . GERD (gastroesophageal reflux disease)     takes Omeprazole daily  . Constipation   . Diarrhea   . Hyperthyroidism     takes [employer] daily  . Diabetes mellitus without complication (HCC) 09/2013    takes Metformin daily  . Peripheral neuropathy (HCC)     takes Lyrica daily  . Dysphagia   . H/O hiatal hernia   . Sleep apnea     uses CPAP-study done in Dec 2014-to request report from [facility] in [place]  . Allergy    (-) asthma/copd/CA/DVT  Family History  Problem Relation Age of Onset  . Multiple births Mother   . Ovarian cancer Mother   . Hypertension Mother   . Hypertension Father   . Heart disease Father   . Heart attack Father   . Diabetes Brother   . Hypertension Brother   . Thyroid disease Paternal Aunt   . Thyroid disease Paternal Grandmother   . Osteoporosis Other   . Hypertension Brother   . Hypertension Sister      Past Surgical History  Procedure Laterality Date  . Cholecystectomy    . Essure tubal ligation  11/2006    one tube still open  . Tcs    . Esophagogastroduodenoscopy    . Ventral hernia repair N/A 12/14/2013  Procedure: LAPAROSCOPIC VENTRAL HERNIA;  Surgeon: Robyne Askew, MD;  Location: [facility] OR;  Service: General;  Laterality: N/A;  . Insertion of mesh N/A 12/14/2013    Procedure: INSERTION OF MESH;  Surgeon: Robyne Askew, MD;  Location[facility] OR;  Service: General;  Laterality: N/A;    Social History   Social History  . Marital Status: Married    Spouse Name: N/A  . Number of Children: 1  . Years of Education: N/A   Occupational History  .     Social History Main Topics  . Smoking status: Never Smoker   . Smokeless tobacco: Never Used  . Alcohol Use: No  . Drug Use: No  . Sexual Activity: Yes    Birth Control/ Protection: Surgical, Pill   Other Topics  Concern  . Not on file   Social History Narrative    Married, non smoker, has 1 child. Works as a Chemical engineer.   Allergies  Allergen Reactions  . Latex Hives     Outpatient Prescriptions Prior to Visit  Medication Sig Dispense Refill  . b complex vitamins tablet Take 1 tablet by mouth daily.    . Calcium Carbonate (CALTRATE 600 PO) Take 600 mg by mouth daily.     . cetirizine (ZYRTEC) 10 MG tablet Take 10 mg by mouth daily.    Marland Kitchen levothyroxine (SYNTHROID, LEVOTHROID) 50 MCG tablet Take 1 tablet by mouth daily before breakfast.  1  . lisinopril-hydrochlorothiazide (PRINZIDE,ZESTORETIC) 20-25 MG tablet Take 1 tablet by mouth daily.  5  . LYRICA 150 MG capsule Take 1 capsule by mouth 2 (two) times daily.  5  . metFORMIN (GLUCOPHAGE) 500 MG tablet Take 1 tablet by mouth 2 (two) times daily.  3  . norethindrone (JOLIVETTE) 0.35 MG tablet TAKE 1 TABLET BY MOUTH EVERY DAY 3 Package 3  . omeprazole (PRILOSEC) 40 MG capsule Take 40 mg by mouth daily.    . pravastatin (PRAVACHOL) 80 MG tablet Take 1 tablet by mouth daily.  3  . terconazole (TERAZOL 7) 0.4 % vaginal cream Place 1 applicator vaginally at bedtime. 45 g 0   No facility-administered medications prior to visit.   Meds ordered this encounter  Medications  . albuterol (PROVENTIL HFA;VENTOLIN HFA) 108 (90 Base) MCG/ACT inhaler    Sig: Inhale into the lungs.          Objective:   Physical Exam   Vitals:  Filed Vitals:   01/26/16 1510  BP: 126/76  Pulse: 87  Height:  (1.549 m)  Weight: 235 lb 9.6 oz (106.867 kg)  SpO2: 99%    Constitutional/General:  Pleasant, well-nourished, well-developed, not in any distress,  Comfortably seating.  Well kempt  Body mass index is 44.54 kg/(m^2). Wt Readings from Last 3 Encounters:  01/26/16 235 lb 9.6 oz (106.867 kg)  12/23/15 230 lb (104.327 kg)  12/09/15 231 lb (104.781 kg)    Neck circumference:   HEENT: Pupils equal and reactive to light and accommodation.  Anicteric sclerae. Normal nasal mucosa.   No oral  lesions,  mouth clear,  oropharynx clear, no postnasal drip. (-) Oral thrush. No dental caries.  Airway - Mallampati class III  Neck: No masses. Midline trachea. No JVD, (-) LAD. (-) bruits appreciated.  Respiratory/Chest: Grossly normal chest. (-) deformity. (-) Accessory muscle use.  Symmetric expansion. (-) Tenderness on palpation.  Resonant on percussion.  Diminished BS on both lower lung zones. (-) wheezing, crackles, rhonchi (-) egophony  Cardiovascular: Regular  rate and  rhythm, heart sounds normal, no murmur or gallops, no peripheral edema  Gastrointestinal:  Normal bowel sounds. Soft, non-tender. No hepatosplenomegaly.  (-) masses.   Musculoskeletal:  Normal muscle tone. Normal gait.   Extremities: Grossly normal. (-) clubbing, cyanosis.  (-) edema  Skin: (-) rash,lesions seen.   Neurological/Psychiatric : alert, oriented to time, place, person. Normal mood and affect           Assessment & Plan:  Cough Patient is here for cough since December 2016. Initially started when she was exposed to her newly painted car. Cough is some better but is persistent. Differentials include the following: #1. She is on lisinopril. Told her to call her primary care doctor to have that switched to something else such as ARB. #2. Likely also related to reflux disease. Continue omeprazole, take it at bedtime. Dietary modifications. Head of bed 30. #3. Could also be hyperreactive airway disease since exposure to that pain. We'll start her on Symbicort, 160/4.5, 2 puffs twice a day. If she is better, hopefully we can wean this. #4. Told her to check her environment, workplace, home to see if there is something that she is allergic to causing her to cough. #5. Denies any sinus issues. #6Maryclare Labrador. We'll consider prednisone, Singulair, allergy meds if cough is persistent. #7. Chest x-ray done had no font in February 2017-- no acute changes. I  am unable to visualize the x-ray.  Asthma Likely hyperreactive airway disease from exposure to paint in December 2016. Start Symbicort 160/4.5, 2 puffs twice a day.  OSA (obstructive sleep apnea) Patient has OSA. Compliant with treatment. Cont CPAP.  RTC in 2-3 mos. Sooner if not better.  Pollie MeyerJ. Angelo A de Dios, MD 01/26/2016, 5:08 PM [facility] Pager [phone] After 3 pm or if no answer, call [phone]

## 2016-01-26 NOTE — Assessment & Plan Note (Signed)
Patient has OSA. Compliant with treatment. Cont CPAP.

## 2016-02-02 ENCOUNTER — Ambulatory Visit
Admission: RE | Admit: 2016-02-02 | Discharge: 2016-02-02 | Disposition: A | Discharge status: Home / Self Care | Payer: Federal, State, Local not specified - PPO | Source: Ambulatory Visit

## 2016-02-02 DIAGNOSIS — Z1231 Encounter for screening mammogram for malignant neoplasm of breast: Secondary | ICD-10-CM

## 2016-05-16 ENCOUNTER — Ambulatory Visit: Payer: Federal, State, Local not specified - PPO | Admitting: Pulmonary Disease

## 2016-09-21 DIAGNOSIS — R109 Unspecified abdominal pain: Secondary | ICD-10-CM | POA: Insufficient documentation

## 2016-11-15 ENCOUNTER — Other Ambulatory Visit: Payer: Self-pay | Admitting: Family Medicine

## 2016-11-15 DIAGNOSIS — Z1231 Encounter for screening mammogram for malignant neoplasm of breast: Secondary | ICD-10-CM

## 2016-11-20 ENCOUNTER — Other Ambulatory Visit: Payer: Self-pay | Admitting: Nurse Practitioner

## 2016-11-20 NOTE — Telephone Encounter (Signed)
Medication refill request: Norethindrone Last AEX:  12/23/15 PG Next AEX: 12/28/16 PG Last MMG (if hormonal medication request): 02/02/16 Samantha [email or web address], Density C, [facility] Refill authorized: 12/23/15 #3 Packages 3R. Please advise. Thank you.

## 2016-11-20 NOTE — Telephone Encounter (Signed)
Medication refill request: Micronor  Last AEX:  12-23-15  Next AEX: 12-28-16  Last MMG (if hormonal medication request): 02-03-16 WNL  Refill authorized: please advise

## 2016-12-18 ENCOUNTER — Other Ambulatory Visit: Payer: Self-pay | Admitting: Nurse Practitioner

## 2016-12-18 NOTE — Telephone Encounter (Signed)
Medication refill request: Norethindrone Last AEX:  12/23/15 PG Next AEX: 12/28/16 PG Last MMG (if hormonal medication request): 02/02/16 [email or web address], Density C, TBC Refill authorized: 11/20/16 #28 0R. Please advise. Thank you.

## 2016-12-26 ENCOUNTER — Encounter: Payer: Self-pay | Admitting: Nurse Practitioner

## 2016-12-26 NOTE — Progress Notes (Signed)
Patient ID: Samantha [name], female   DOB: 03/23/1966, 51 y.o.   MRN: [id number]  51 y.o. G2P1011 Married  African American Fe here for annual exam.  POP with amenorrhea  11/2007.  No other new problems.  Last year RAD was found to be related to HTN med's.last HGB AIC 6.4. - 12/19/16.   Patient's last menstrual period was 11/07/2007 (exact date).          Sexually active: Yes.    The current method of family planning is tubal ligation.  (Essure 2008)  Exercising: Yes.    gym Smoker:  no  Health Maintenance: Pap: 12/23/15, Negative with neg HR HPV  11/24/12, Negative  MMG: 02/02/16, Bi-Rads 1: Negative, scheduled 02/04/17 Colonoscopy: 06/2016 Normal - f/u 10 years  BMD: 01/29/14, Z Score: -0.9 Spine / -0.1 Left Femur Neck TDaP: 11/24/12 Pneumonia: 11/24/14 Pneumovax HIV: 11/06/95 with pregnancy Labs: PCP   reports that she has never smoked. She has never used smokeless tobacco. She reports that she does not drink alcohol or use drugs.  Past Medical History:  Diagnosis Date  . Allergy   . Constipation   . Diabetes mellitus without complication (HCC) 09/2013   takes Metformin daily  . Diarrhea   . Dysphagia   . GERD (gastroesophageal reflux disease)    takes Omeprazole daily  . H/O hiatal hernia   . Heart murmur   . Hyperlipidemia    but no meds required   . Hypertension    takes HCTZ daily  . Hyperthyroidism    takes [employer]ill Lynchature Throid daily  . Peripheral neuropathy (HCC)    takes Lyrica daily  . Sleep apnea    uses CPAP-study done in Dec 2014-to request report from [facility] in [place]    Past Surgical History:  Procedure Laterality Date  . CHOLECYSTECTOMY    . ESOPHAGOGASTRODUODENOSCOPY    . ESSURE TUBAL LIGATION  11/2006   one tube still open  . INSERTION OF MESH N/A 12/14/2013   Procedure: INSERTION OF MESH;  Surgeon: Robyne AskewPaul S Toth III, MD;  Location: [facility] OR;  Service: General;  Laterality: N/A;  . TCS    . VENTRAL HERNIA REPAIR N/A 12/14/2013   Procedure: LAPAROSCOPIC  VENTRAL HERNIA;  Surgeon: Robyne AskewPaul S Toth III, MD;  Location: [facility] OR;  Service: General;  Laterality: N/A;    Current Outpatient Prescriptions  Medication Sig Dispense Refill  . albuterol (PROVENTIL HFA;VENTOLIN HFA) 108 (90 Base) MCG/ACT inhaler Inhale into the lungs.    Marland Kitchen. b complex vitamins tablet Take 1 tablet by mouth daily.    . Calcium Carbonate (CALTRATE 600 PO) Take 600 mg by mouth daily.     . cetirizine (ZYRTEC) 10 MG tablet Take 10 mg by mouth daily.    Marland Kitchen. levothyroxine (SYNTHROID, LEVOTHROID) 50 MCG tablet Take 1 tablet by mouth daily before breakfast.  1  . losartan-hydrochlorothiazide (HYZAAR) 100-25 MG tablet Take 1 tablet by mouth daily.  5  . LYRICA 225 MG capsule Take 225 mg by mouth 2 (two) times daily.  5  . metFORMIN (GLUCOPHAGE) 500 MG tablet Take 1 tablet by mouth 2 (two) times daily.  3  . Multiple Vitamin (MULTI-VITAMINS) TABS Take 1 tablet by mouth daily.    . norethindrone (MICRONOR,CAMILA,ERRIN) 0.35 MG tablet Take 1 tablet (0.35 mg total) by mouth daily. 3 Package 4  . pravastatin (PRAVACHOL) 80 MG tablet Take 1 tablet by mouth daily.  3   No current facility-administered medications for this visit.  Family History  Problem Relation Age of Onset  . Multiple births Mother   . Ovarian cancer Mother   . Hypertension Mother   . Hypertension Father   . Heart disease Father   . Heart attack Father   . Diabetes Brother   . Hypertension Brother   . Thyroid disease Paternal Grandmother   . Hypertension Brother   . Hypertension Sister   . Thyroid disease Paternal Aunt   . Osteoporosis Other     ROS:  Pertinent items are noted in HPI.  Otherwise, a comprehensive ROS was negative.  Exam:   BP 118/86 (BP Location: Right Arm, Patient Position: Sitting, Cuff Size: Large)   Pulse 74   Resp 16   Ht 5' 1.25" (1.556 m)   Wt 231 lb (104.8 kg)   LMP 11/07/2007 (Exact Date)   BMI 43.29 kg/m  Height: 5' 1.25" (155.6 cm) Ht Readings from Last 3 Encounters:  12/28/16  5' 1.25" (1.556 m)  01/26/16 5\' 1"  (1.549 m)  12/23/15 5' 1.25" (1.556 m)    General appearance: alert, cooperative and appears stated age Head: Normocephalic, without obvious abnormality, atraumatic Neck: no adenopathy, supple, symmetrical, trachea midline and thyroid normal to inspection and palpation Lungs: clear to auscultation bilaterally Breasts: normal appearance, no masses or tenderness Heart: regular rate and rhythm Abdomen: soft, non-tender; no masses,  no organomegaly Extremities: extremities normal, atraumatic, no cyanosis or edema Skin: Skin color, texture, turgor normal. No rashes or lesions Lymph nodes: Cervical, supraclavicular, and axillary nodes normal. No abnormal inguinal nodes palpated Neurologic: Grossly normal   Pelvic: External genitalia:  no lesions              Urethra:  normal appearing urethra with no masses, tenderness or lesions              Bartholin's and Skene's: normal                 Vagina: normal appearing vagina with normal color and discharge, no lesions              Cervix: anteverted              Pap taken: No. Bimanual Exam:  Uterus:  normal size, contour, position, consistency, mobility, non-tender              Adnexa: no mass, fullness, tenderness               Rectovaginal: Confirms               Anus:  normal sphincter tone, no lesions  Chaperone present: yes  A:  Well Woman with normal exam   Perimenopausal on POP since 11/2006 with current amenorrhea S/P Essure for birth control 11/2006 with one tube that is open  Vit D deficiency Obesity, DM, HTN Diabetic neuropathy - on Lyrica               P:   Reviewed health and wellness pertinent to exam  Pap smear was not done  Mammogram is due 02/2017  Refill on POP for cycle regulation, birth control, and risk of endometrial hyperplasia  Obesity with BMI 43.29  Counseled on breast self exam, mammography screening, use and side effects  of POP's, adequate intake of calcium and vitamin D, diet and exercise return annually or prn  An After Visit Summary was printed and given to the patient.

## 2016-12-28 ENCOUNTER — Ambulatory Visit (INDEPENDENT_AMBULATORY_CARE_PROVIDER_SITE_OTHER): Payer: Federal, State, Local not specified - PPO | Admitting: Nurse Practitioner

## 2016-12-28 ENCOUNTER — Encounter: Payer: Self-pay | Admitting: Nurse Practitioner

## 2016-12-28 ENCOUNTER — Ambulatory Visit: Payer: Federal, State, Local not specified - PPO | Admitting: Nurse Practitioner

## 2016-12-28 VITALS — BP 118/86 | HR 74 | Resp 16 | Ht 61.25 in | Wt 231.0 lb

## 2016-12-28 DIAGNOSIS — I10 Essential (primary) hypertension: Secondary | ICD-10-CM | POA: Diagnosis not present

## 2016-12-28 DIAGNOSIS — Z01411 Encounter for gynecological examination (general) (routine) with abnormal findings: Secondary | ICD-10-CM | POA: Diagnosis not present

## 2016-12-28 DIAGNOSIS — E1140 Type 2 diabetes mellitus with diabetic neuropathy, unspecified: Secondary | ICD-10-CM

## 2016-12-28 DIAGNOSIS — Z0000 Encounter for general adult medical examination without abnormal findings: Secondary | ICD-10-CM

## 2016-12-28 MED ORDER — NORETHINDRONE 0.35 MG PO TABS
1.0000 | ORAL_TABLET | Freq: Every day | ORAL | 4 refills | Status: DC
Start: 2016-12-28 — End: 2018-02-04

## 2016-12-28 NOTE — Patient Instructions (Signed)

## 2016-12-30 NOTE — Progress Notes (Signed)
Encounter reviewed by Dr. Brook Amundson C. Silva.  

## 2017-02-04 ENCOUNTER — Ambulatory Visit: Payer: Federal, State, Local not specified - PPO

## 2017-02-04 ENCOUNTER — Ambulatory Visit
Admission: RE | Admit: 2017-02-04 | Discharge: 2017-02-04 | Disposition: A | Discharge status: Home / Self Care | Payer: Federal, State, Local not specified - PPO | Source: Ambulatory Visit | Attending: Family Medicine | Admitting: Family Medicine

## 2017-02-04 DIAGNOSIS — Z1231 Encounter for screening mammogram for malignant neoplasm of breast: Secondary | ICD-10-CM

## 2017-10-21 ENCOUNTER — Other Ambulatory Visit: Payer: Self-pay | Admitting: Obstetrics and Gynecology

## 2017-10-21 DIAGNOSIS — Z1231 Encounter for screening mammogram for malignant neoplasm of breast: Secondary | ICD-10-CM

## 2018-01-03 ENCOUNTER — Ambulatory Visit: Payer: Federal, State, Local not specified - PPO | Admitting: Nurse Practitioner

## 2018-01-09 ENCOUNTER — Ambulatory Visit (INDEPENDENT_AMBULATORY_CARE_PROVIDER_SITE_OTHER): Payer: Federal, State, Local not specified - PPO | Admitting: Obstetrics and Gynecology

## 2018-01-09 ENCOUNTER — Other Ambulatory Visit: Payer: Self-pay

## 2018-01-09 ENCOUNTER — Encounter: Payer: Self-pay | Admitting: Obstetrics and Gynecology

## 2018-01-09 VITALS — BP 132/80 | HR 64 | Resp 12 | Ht 60.5 in | Wt 228.0 lb

## 2018-01-09 DIAGNOSIS — N912 Amenorrhea, unspecified: Secondary | ICD-10-CM

## 2018-01-09 DIAGNOSIS — Z3041 Encounter for surveillance of contraceptive pills: Secondary | ICD-10-CM

## 2018-01-09 DIAGNOSIS — Z01419 Encounter for gynecological examination (general) (routine) without abnormal findings: Secondary | ICD-10-CM | POA: Diagnosis not present

## 2018-01-09 DIAGNOSIS — N3941 Urge incontinence: Secondary | ICD-10-CM | POA: Diagnosis not present

## 2018-01-09 NOTE — Progress Notes (Signed)
52 y.o. [id number] MarriedAfrican AmericanF here for annual exam.  On micronor for contraception, cycle control. No cycle for years. Prior to the micronor her cycles were irregular. Occasionally gets "hot" not sure if they are hot flashes, not having night sweats or vaginal dryness.   She occasionally leaks a a small amount of urine on the way to the bathroom with a full bladder. Occurs daily. She drinks one soda a day and hot tea. Symptoms started 3-4 months ago. She does have urinary frequency, has diabetes, drinks lots of water. Voids normal amounts.  She is also on a diuretic. Last HgbA1C was 6.9%.    No LMP recorded. Patient has had an implant.          Sexually active: Yes.    The current method of family planning is POP (progesterone).    Exercising: Yes.    gym/ cardio/ walking/ weights Smoker:  no  Health Maintenance: Pap:  12-23-15 WNL NEG HR HPV  History of abnormal Pap:  Yes- repeat PAP normal per patient  MMG:  02-04-17 WNL  Colonoscopy:  2016 normal per patient  BMD:   01-29-14 WNL (done after a foot fracture.  TDaP:  11-24-12 Gardasil: N/A   reports that  has never smoked. she has never used smokeless tobacco. She reports that she does not drink alcohol or use drugs. 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  3 year old daughter is a Restaurant manager, fast food. Wants to go to OT school.   Past Medical History:  Diagnosis Date  . Allergy   . Constipation   . Diabetes mellitus without complication (HCC) 09/2013   takes Metformin daily  . Diarrhea   . Dysphagia   . GERD (gastroesophageal reflux disease)    takes Omeprazole daily  . H/O hiatal hernia   . Heart murmur   . Hyperlipidemia    but no meds required   . Hypertension    takes HCTZ daily  . Hyperthyroidism    takes [employer] daily  . Peripheral neuropathy    takes Lyrica daily  . Sleep apnea    uses CPAP-study done in Dec 2014-to request report from [facility] in [place]    Past Surgical  History:  Procedure Laterality Date  . CHOLECYSTECTOMY    . ESOPHAGOGASTRODUODENOSCOPY    . ESSURE TUBAL LIGATION  11/2006   one tube still open  . INSERTION OF MESH N/A 12/14/2013   Procedure: INSERTION OF MESH;  Surgeon: Robyne Askew, MD;  Location: [facility] OR;  Service: General;  Laterality: N/A;  . TCS    . VENTRAL HERNIA REPAIR N/A 12/14/2013   Procedure: LAPAROSCOPIC VENTRAL HERNIA;  Surgeon: Robyne Askew, MD;  Location: [facility] OR;  Service: General;  Laterality: N/A;    Current Outpatient Medications  Medication Sig Dispense Refill  . albuterol (PROVENTIL HFA;VENTOLIN HFA) 108 (90 Base) MCG/ACT inhaler Inhale into the lungs.    Marland Kitchen b complex vitamins tablet Take 1 tablet by mouth daily.    . Calcium Carbonate (CALTRATE 600 PO) Take 600 mg by mouth daily.     . cetirizine (ZYRTEC) 10 MG tablet Take 10 mg by mouth daily.    Marland Kitchen levothyroxine (SYNTHROID, LEVOTHROID) 50 MCG tablet Take 1 tablet by mouth daily before breakfast.  1  . losartan-hydrochlorothiazide (HYZAAR) 100-25 MG tablet Take 1 tablet by mouth daily.  5  . LYRICA 225 MG capsule Take 225 mg by mouth 2 (two) times daily.  5  .  metFORMIN (GLUCOPHAGE) 500 MG tablet Take 1 tablet by mouth 2 (two) times daily.  3  . Multiple Vitamin (MULTI-VITAMINS) TABS Take 1 tablet by mouth daily.    . norethindrone (MICRONOR,CAMILA,ERRIN) 0.35 MG tablet Take 1 tablet (0.35 mg total) by mouth daily. 3 Package 4  . pravastatin (PRAVACHOL) 80 MG tablet Take 1 tablet by mouth daily.  3   No current facility-administered medications for this visit.     Family History  Problem Relation Age of Onset  . Multiple births Mother   . Hypertension Mother   . Hypertension Father   . Heart disease Father   . Heart attack Father   . Diabetes Brother   . Hypertension Brother   . Thyroid disease Paternal Grandmother   . Hypertension Brother   . Hypertension Sister   . Thyroid disease Paternal Aunt   . Osteoporosis Other     Review of Systems   Constitutional: Negative.   HENT: Negative.   Eyes: Negative.   Respiratory: Negative.   Cardiovascular: Negative.   Gastrointestinal: Negative.   Endocrine: Negative.   Genitourinary: Negative.   Musculoskeletal: Negative.   Skin: Negative.   Allergic/Immunologic: Negative.   Neurological: Negative.   Psychiatric/Behavioral: Negative.     Exam:   BP 132/80 (BP Location: Right Arm, Patient Position: Sitting, Cuff Size: Large)   Pulse 64   Resp 12   Ht 5' 0.5" (1.537 m)   Wt 228 lb (103.4 kg)   BMI 43.80 kg/m   Weight change: @WEIGHTCHANGE @ Height:   Height: 5' 0.5" (153.7 cm)  Ht Readings from Last 3 Encounters:  01/09/18 5' 0.5" (1.537 m)  12/28/16 5' 1.25" (1.556 m)  01/26/16 5\' 1"  (1.549 m)    General appearance: alert, cooperative and appears stated age Head: Normocephalic, without obvious abnormality, atraumatic Neck: no adenopathy, supple, symmetrical, trachea midline and thyroid normal to inspection and palpation Lungs: clear to auscultation bilaterally Cardiovascular: regular rate and rhythm Breasts: normal appearance, no masses or tenderness Abdomen: soft, non-tender; non distended,  no masses,  no organomegaly Extremities: extremities normal, atraumatic, no cyanosis or edema Skin: Skin color, texture, turgor normal. No rashes or lesions Lymph nodes: Cervical, supraclavicular, and axillary nodes normal. No abnormal inguinal nodes palpated Neurologic: Grossly normal   Pelvic: External genitalia:  no lesions              Urethra:  normal appearing urethra with no masses, tenderness or lesions              Bartholins and Skenes: normal                 Vagina: normal appearing vagina with normal color and discharge, no lesions              Cervix: no lesions               Bimanual Exam:  Uterus:  normal size, contour, position, consistency, mobility, non-tender and anteverted              Adnexa: no mass, fullness, tenderness               Rectovaginal:  Confirms               Anus:  normal sphincter tone, no lesions  Chaperone was present for exam.  A:  Well Woman with normal exam  On POP for contraception and cycle control  Urge incontinence   P:   Pap next year  ccua for ua,  c&s  Discussed limiting caffeine, kegels and the option for medication  Stop POP, check a FSH in one week, then decide on continuing POP or cyclic progesterone  Discussed breast self exam  Discussed calcium and vit D intake

## 2018-01-09 NOTE — Patient Instructions (Addendum)
EXERCISE AND DIET:  We recommended that you start or continue a regular exercise program for good health. Regular exercise means any activity that makes your heart beat faster and makes you sweat.  We recommend exercising at least 30 minutes per day at least 3 days a week, preferably 4 or 5.  We also recommend a diet low in fat and sugar.  Inactivity, poor dietary choices and obesity can cause diabetes, heart attack, stroke, and kidney damage, among others.    ALCOHOL AND SMOKING:  Women should limit their alcohol intake to no more than 7 drinks/beers/glasses of wine (combined, not each!) per week. Moderation of alcohol intake to this level decreases your risk of breast cancer and liver damage. And of course, no recreational drugs are part of a healthy lifestyle.  And absolutely no smoking or even second hand smoke. Most people know smoking can cause heart and lung diseases, but did you know it also contributes to weakening of your bones? Aging of your skin?  Yellowing of your teeth and nails?  CALCIUM AND VITAMIN D:  Adequate intake of calcium and Vitamin D are recommended.  The recommendations for exact amounts of these supplements seem to change often, but generally speaking 600 mg of calcium (either carbonate or citrate) and 800 units of Vitamin D per day seems prudent. Certain women may benefit from higher intake of Vitamin D.  If you are among these women, your doctor will have told you during your visit.    PAP SMEARS:  Pap smears, to check for cervical cancer or precancers,  have traditionally been done yearly, although recent scientific advances have shown that most women can have pap smears less often.  However, every woman still should have a physical exam from her gynecologist every year. It will include a breast check, inspection of the vulva and vagina to check for abnormal growths or skin changes, a visual exam of the cervix, and then an exam to evaluate the size and shape of the uterus and  ovaries.  And after 52 years of age, a rectal exam is indicated to check for rectal cancers. We will also provide age appropriate advice regarding health maintenance, like when you should have certain vaccines, screening for sexually transmitted diseases, bone density testing, colonoscopy, mammograms, etc.   MAMMOGRAMS:  All women over 40 years old should have a yearly mammogram. Many facilities now offer a "3D" mammogram, which may cost around $50 extra out of pocket. If possible,  we recommend you accept the option to have the 3D mammogram performed.  It both reduces the number of women who will be called back for extra views which then turn out to be normal, and it is better than the routine mammogram at detecting truly abnormal areas.    COLONOSCOPY:  Colonoscopy to screen for colon cancer is recommended for all women at age 50.  We know, you hate the idea of the prep.  We agree, BUT, having colon cancer and not knowing it is worse!!  Colon cancer so often starts as a polyp that can be seen and removed at colonscopy, which can quite literally save your life!  And if your first colonoscopy is normal and you have no family history of colon cancer, most women don't have to have it again for 10 years.  Once every ten years, you can do something that may end up saving your life, right?  We will be happy to help you get it scheduled when you are ready.    Be sure to check your insurance coverage so you understand how much it will cost.  It may be covered as a preventative service at no cost, but you should check your particular policy.      Kegel Exercises Kegel exercises help strengthen the muscles that support the rectum, vagina, small intestine, bladder, and uterus. Doing Kegel exercises can help:  Improve bladder and bowel control.  Improve sexual response.  Reduce problems and discomfort during pregnancy.  Kegel exercises involve squeezing your pelvic floor muscles, which are the same muscles you  squeeze when you try to stop the flow of urine. The exercises can be done while sitting, standing, or lying down, but it is best to vary your position. Phase 1 exercises 1. Squeeze your pelvic floor muscles tight. You should feel a tight lift in your rectal area. If you are a female, you should also feel a tightness in your vaginal area. Keep your stomach, buttocks, and legs relaxed. 2. Hold the muscles tight for up to 10 seconds. 3. Relax your muscles. Repeat this exercise 50 times a day or as many times as told by your health care provider. Continue to do this exercise for at least 4-6 weeks or for as long as told by your health care provider. This information is not intended to replace advice given to you by your health care provider. Make sure you discuss any questions you have with your health care provider. Document Released: 10/08/2012 Document Revised: 06/16/2016 Document Reviewed: 09/11/2015 Elsevier Interactive Patient Education  2018 [employer] Urinary Incontinence Urinary incontinence is the involuntary loss of urine from your bladder. What are the causes? There are many causes of urinary incontinence. They include:  Medicines.  Infections.  Prostatic enlargement, leading to overflow of urine from your bladder.  Surgery.  Neurological diseases.  Emotional factors.  What are the signs or symptoms? Urinary Incontinence can be divided into four types: 4. Urge incontinence. Urge incontinence is the involuntary loss of urine before you have the opportunity to go to the bathroom. There is a sudden urge to void but not enough time to reach a bathroom. 5. Stress incontinence. Stress incontinence is the sudden loss of urine with any activity that forces urine to pass. It is commonly caused by anatomical changes to the pelvis and sphincter areas of your body. 6. Overflow incontinence. Overflow incontinence is the loss of urine from an obstructed opening to your bladder. This  results in a backup of urine and a resultant buildup of pressure within the bladder. When the pressure within the bladder exceeds the closing pressure of the sphincter, the urine overflows, which causes incontinence, similar to water overflowing a dam. 7. Total incontinence. Total incontinence is the loss of urine as a result of the inability to store urine within your bladder.  How is this diagnosed? Evaluating the cause of incontinence may require:  A thorough and complete medical and obstetric history.  A complete physical exam.  Laboratory tests such as a urine culture and sensitivities.  When additional tests are indicated, they can include:  An ultrasound exam.  Kidney and bladder X-rays.  Cystoscopy. This is an exam of the bladder using a narrow scope.  Urodynamic testing to test the nerve function to the bladder and sphincter areas.  How is this treated? Treatment for urinary incontinence depends on the cause:  For urge incontinence caused by a bacterial infection, antibiotics will be prescribed. If the urge incontinence is related to medicines you take, your health  care provider may have you change the medicine.  For stress incontinence, surgery to re-establish anatomical support to the bladder or sphincter, or both, will often correct the condition.  For overflow incontinence caused by an enlarged prostate, an operation to open the channel through the enlarged prostate will allow the flow of urine out of the bladder. In women with fibroids, a hysterectomy may be recommended.  For total incontinence, surgery on your urinary sphincter may help. An artificial urinary sphincter (an inflatable cuff placed around the urethra) may be required. In women who have developed a hole-like passage between their bladder and vagina (vesicovaginal fistula), surgery to close the fistula often is required.  Follow these instructions at home:  Normal daily hygiene and the use of pads or  adult diapers that are changed regularly will help prevent odors and skin damage.  Avoid caffeine. It can overstimulate your bladder.  Use the bathroom regularly. Try about every 2-3 hours to go to the bathroom, even if you do not feel the need to do so. Take time to empty your bladder completely. After urinating, wait a minute. Then try to urinate again.  For causes involving nerve dysfunction, keep a log of the medicines you take and a journal of the times you go to the bathroom. Contact a health care provider if:  You experience worsening of pain instead of improvement in pain after your procedure.  Your incontinence becomes worse instead of better. Get help right away if:  You experience fever or shaking chills.  You are unable to pass your urine.  You have redness spreading into your groin or down into your thighs. This information is not intended to replace advice given to you by your health care provider. Make sure you discuss any questions you have with your health care provider. Document Released: 11/29/2004 Document Revised: 06/01/2016 Document Reviewed: 03/31/2013 Elsevier Interactive Patient Education  [employer].

## 2018-01-10 LAB — URINALYSIS, MICROSCOPIC ONLY
Bacteria, UA: NONE SEEN
Casts: NONE SEEN /lpf

## 2018-01-10 LAB — URINE CULTURE

## 2018-01-13 ENCOUNTER — Other Ambulatory Visit: Payer: Self-pay | Admitting: *Deleted

## 2018-01-13 MED ORDER — AMOXICILLIN 500 MG PO CAPS: 500.0000 mg | ORAL_CAPSULE | Freq: Three times a day (TID) | ORAL | 0 refills | Status: AC

## 2018-01-20 ENCOUNTER — Telehealth: Payer: Self-pay | Admitting: Obstetrics and Gynecology

## 2018-01-20 DIAGNOSIS — N912 Amenorrhea, unspecified: Secondary | ICD-10-CM

## 2018-01-20 NOTE — Telephone Encounter (Signed)
Patient called requesting an order for: follicle stimulating hormone. She'd like to have it drawn at another [facility] site.

## 2018-01-20 NOTE — Telephone Encounter (Signed)
Patient was seen on 01/09/2018 for aex with Dr.Jertson. Patient was to stop POP, check a FSH in one week, then decide on continuing POP or cyclic progesterone. Patient would like to have her FSH drawn at outside [facility]. Ordered placed and released. Patient will call the office when she has lab drawn so that we are aware this was completed.  Routing to provider for final review. Patient agreeable to disposition. Will close encounter.

## 2018-01-29 ENCOUNTER — Telehealth: Payer: Self-pay | Admitting: Obstetrics and Gynecology

## 2018-01-29 NOTE — Telephone Encounter (Signed)
Patient was told to call when she had [facility]. Patient had these labs done this morning 01/29/18.

## 2018-01-29 NOTE — Telephone Encounter (Signed)
Patient had FSH drawn at outside [facility] this morning. Routing to Dr.Jertson as FYI. Encounter closed.

## 2018-01-30 LAB — FOLLICLE STIMULATING HORMONE: FSH: 30.4 m[IU]/mL

## 2018-01-31 ENCOUNTER — Other Ambulatory Visit: Payer: Federal, State, Local not specified - PPO

## 2018-02-04 ENCOUNTER — Telehealth: Payer: Self-pay | Admitting: Obstetrics and Gynecology

## 2018-02-04 MED ORDER — NORETHINDRONE 0.35 MG PO TABS: 1.0000 | ORAL_TABLET | Freq: Every day | ORAL | 4 refills | Status: DC

## 2018-02-04 NOTE — Telephone Encounter (Signed)
Spoke with patient. Results given. Patient verbalizes understanding. Patient would like to stay on Micronor. Rx for Micronor #3 4RF sent to pharmacy on file. Patient is agreeable. Encounter closed.  Notes recorded by Romualdo BolkJertson, Jill Evelyn, MD on 01/30/2018 at 5:31 PM EDT Please let the patient know that [facility] in the PMP range. Given the #, I suspect that she is perimenopausal. She can either continue the micronor for another year, or use cyclic provera (5 mg x 5 days every other month if no spontaneous cycle). The provera isn't contraception, it would be very unlikely for her to get pregnant.

## 2018-02-04 NOTE — Telephone Encounter (Signed)
Patient checking if Dr Oscar LaJertson received her lab work she had done last week at an outside lab.

## 2018-02-05 ENCOUNTER — Ambulatory Visit
Admission: RE | Admit: 2018-02-05 | Discharge: 2018-02-05 | Disposition: A | Discharge status: Home / Self Care | Payer: Federal, State, Local not specified - PPO | Source: Ambulatory Visit | Attending: Obstetrics and Gynecology | Admitting: Obstetrics and Gynecology

## 2018-02-05 DIAGNOSIS — Z1231 Encounter for screening mammogram for malignant neoplasm of breast: Secondary | ICD-10-CM

## 2018-04-17 ENCOUNTER — Telehealth: Payer: Self-pay | Admitting: Obstetrics and Gynecology

## 2018-04-17 MED ORDER — MEDROXYPROGESTERONE ACETATE 5 MG PO TABS: 5.0000 mg | ORAL_TABLET | Freq: Every day | ORAL | 1 refills | Status: DC

## 2018-04-17 NOTE — Telephone Encounter (Signed)
Left message to call Noreene LarssonJill at [phone].   See results and recommendations dated 01/29/18

## 2018-04-17 NOTE — Telephone Encounter (Signed)
Yes, please send one refill. Thanks!

## 2018-04-17 NOTE — Telephone Encounter (Signed)
Patient stated that she talked with Dr. Oscar LaJertson about coming off of birth control because she is pre-menopausal. She stated that she is ready to do that and wants to know if she should quit all together or if she needs to taper. She would also like to know if she should expect bleeding, and if so, how soon and how heavy?

## 2018-04-17 NOTE — Telephone Encounter (Signed)
Rx for Provera 5mg  #15/1RF sent, patient notified of Rx and instructions for provera reviewed again. Patient read back and verbalizes understanding. Encounter closed.

## 2018-04-17 NOTE — Telephone Encounter (Signed)
Spoke with patient, patient states she was recently seen by her eye doctor for spot on the back of her eye, would like to stop POP pills. Has not had regular cycles with POP.   Reviewed results & recommendations dated 01/29/18 per Dr. Oscar LaJertson. Patient is nearing end of pack of POP, will not start new pack. Advised patient may or may not have bleeding, reviewed cyclic provera instructions. Confirmed pharmacy on file.   Advised will review with Dr. Oscar LaJertson and notify once RX has been sent.   Rx pended.   Dr. Oscar LaJertson -please review, ok to send Rx for Provera 5mg  po daily x5 days q other month if no spontaneous menses #15/0RF?

## 2018-10-07 ENCOUNTER — Other Ambulatory Visit: Payer: Self-pay | Admitting: Obstetrics and Gynecology

## 2018-10-07 DIAGNOSIS — Z1231 Encounter for screening mammogram for malignant neoplasm of breast: Secondary | ICD-10-CM

## 2019-02-04 ENCOUNTER — Ambulatory Visit: Payer: Federal, State, Local not specified - PPO | Admitting: Obstetrics and Gynecology

## 2019-02-09 ENCOUNTER — Ambulatory Visit: Payer: Federal, State, Local not specified - PPO

## 2019-03-16 ENCOUNTER — Other Ambulatory Visit: Payer: Self-pay

## 2019-03-16 ENCOUNTER — Ambulatory Visit
Admission: RE | Admit: 2019-03-16 | Discharge: 2019-03-16 | Disposition: A | Discharge status: Home / Self Care | Payer: Federal, State, Local not specified - PPO | Source: Ambulatory Visit | Attending: Obstetrics and Gynecology | Admitting: Obstetrics and Gynecology

## 2019-03-16 DIAGNOSIS — Z1231 Encounter for screening mammogram for malignant neoplasm of breast: Secondary | ICD-10-CM

## 2019-03-23 ENCOUNTER — Ambulatory Visit: Payer: Federal, State, Local not specified - PPO

## 2019-04-20 NOTE — Progress Notes (Signed)
53 y.o. G72P1011 Married Black or [place] American Not Hispanic or Latino female here for annual exam.   She had been on POP for cycle control, went off last year. She took cyclic provera, no bleeding. She hasn't had a cycle in years. She is hot all the time, no specific hot flashes. No night sweats. No vaginal dryness or dyspareunia. Her urge incontinence is better with Kegels.   She just finished a course of amoxicillin, asking for a script for diflucan. Not currently having any symptoms.  Last HgbA1C was 6.4%.    No LMP recorded. Patient has had an implant.          Sexually active: Yes.    The current method of family planning is Essure.    Exercising: Yes.    walking Smoker:  no  Health Maintenance: Pap:  12-23-15 WNL NEG HR HPV  History of abnormal Pap:  Yes- repeat PAP normal per patient  MMG:  03/16/2019 Birads 1 negative Colonoscopy:  2016 normal per patient, f/u in 10 years.   BMD:   01-29-14 WNL (done after a foot fracture) TDaP:  11-24-12 Gardasil: N/A   reports that she has never smoked. She has never used smokeless tobacco. She reports that she does not drink alcohol or use drugs. 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 Daughter is 82, just graduated from college. Plans to go to OT school, working currently.   Past Medical History:  Diagnosis Date  . Allergy   . Constipation   . Diabetes mellitus without complication ([place]) 10/8785   takes Metformin daily  . Diarrhea   . Dysphagia   . GERD (gastroesophageal reflux disease)    takes Omeprazole daily  . H/O hiatal hernia   . Heart murmur   . Hyperlipidemia    but no meds required   . Hypertension    takes HCTZ daily  . Hyperthyroidism    takes [employer] Rx daily  . Peripheral neuropathy    takes Lyrica daily  . Sleep apnea    uses CPAP-study done in Dec 2014-to request report from [facility] in [place]    Past Surgical History:  Procedure Laterality Date  . CHOLECYSTECTOMY    .  ESOPHAGOGASTRODUODENOSCOPY    . ESSURE TUBAL LIGATION  11/2006   one tube still open  . INSERTION OF MESH N/A 12/14/2013   Procedure: INSERTION OF MESH;  Surgeon: Merrie Roof, MD;  Location: [place];  Service: General;  Laterality: N/A;  . TCS    . VENTRAL HERNIA REPAIR N/A 12/14/2013   Procedure: LAPAROSCOPIC VENTRAL HERNIA;  Surgeon: Merrie Roof, MD;  Location: [facility];  Service: General;  Laterality: N/A;    Current Outpatient Medications  Medication Sig Dispense Refill  . b complex vitamins tablet Take 1 tablet by mouth daily.    . Calcium Carbonate (CALTRATE 600 PO) Take 600 mg by mouth daily.     . cetirizine (ZYRTEC) 10 MG tablet Take 10 mg by mouth daily.    Marland Kitchen levothyroxine (SYNTHROID, LEVOTHROID) 50 MCG tablet Take 1 tablet by mouth daily before breakfast.  1  . losartan-hydrochlorothiazide (HYZAAR) 100-25 MG tablet Take 1 tablet by mouth daily.  5  . LYRICA 225 MG capsule Take 225 mg by mouth 2 (two) times daily.  5  . metFORMIN (GLUCOPHAGE) 500 MG tablet Take 1 tablet by mouth 2 (two) times daily.  3  . Multiple Vitamin (MULTI-VITAMINS) TABS Take 1 tablet by mouth  daily.    . pravastatin (PRAVACHOL) 80 MG tablet Take 1 tablet by mouth daily.  3   No current facility-administered medications for this visit.     Family History  Problem Relation Age of Onset  . Multiple births Mother   . Hypertension Mother   . Hypertension Father   . Heart disease Father   . Heart attack Father   . Diabetes Brother   . Hypertension Brother   . Thyroid disease Paternal Grandmother   . Hypertension Brother   . Hypertension Sister   . Thyroid disease Paternal Aunt   . Osteoporosis Other   . Breast cancer Neg Hx     Review of Systems  Constitutional: Negative.   HENT: Negative.   Eyes: Negative.   Respiratory: Negative.   Cardiovascular: Negative.   Gastrointestinal: Negative.   Endocrine: Negative.   Genitourinary: Negative.   Musculoskeletal: Negative.   Skin: Negative.    Neurological: Negative.   Hematological: Negative.   Psychiatric/Behavioral: Negative.     Exam:   BP 128/84 (BP Location: Right Arm, Patient Position: Sitting, Cuff Size: Large)   Pulse 68   Temp (!) 97.2 F (36.2 C) (Skin)   Ht 5\' 1"  (1.549 m)   Wt 226 lb 9.6 oz (102.8 kg)   BMI 42.82 kg/m   Weight change: @WEIGHTCHANGE @ Height:   Height: 5\' 1"  (154.9 cm)  Ht Readings from Last 3 Encounters:  04/21/19 5\' 1"  (1.549 m)  01/09/18 5' 0.5" (1.537 m)  12/28/16 5' 1.25" (1.556 m)    General appearance: alert, cooperative and appears stated age Head: Normocephalic, without obvious abnormality, atraumatic Neck: no adenopathy, supple, symmetrical, trachea midline and thyroid normal to inspection and palpation Lungs: clear to auscultation bilaterally Cardiovascular: regular rate and rhythm Breasts: normal appearance, no masses or tenderness Abdomen: soft, non-tender; non distended,  no masses,  no organomegaly Extremities: extremities normal, atraumatic, no cyanosis or edema Skin: Skin color, texture, turgor normal. No rashes or lesions Lymph nodes: Cervical, supraclavicular, and axillary nodes normal. No abnormal inguinal nodes palpated Neurologic: Grossly normal   Pelvic: External genitalia:  no lesions              Urethra:  normal appearing urethra with no masses, tenderness or lesions              Bartholins and Skenes: normal                 Vagina: normal appearing vagina with normal color and discharge, no lesions              Cervix: no lesions               Bimanual Exam:  Uterus:  limited by BMI, no masses              Adnexa: no mass, fullness, tenderness               Rectovaginal: Confirms               Anus:  normal sphincter tone, no lesions  Chaperone was present for exam.  A:  Well Woman with normal exam  H/O DM, well controlled  HTN, Hypothyroidism and elevated lipids, managed by primary  P:   Pap with hpv  Mammogram is UTD  Colonoscopy is UTD  Labs  with primary MD  Discussed breast self exam  Discussed calcium and vit D intake

## 2019-04-21 ENCOUNTER — Encounter: Payer: Self-pay | Admitting: Obstetrics and Gynecology

## 2019-04-21 ENCOUNTER — Other Ambulatory Visit: Payer: Self-pay

## 2019-04-21 ENCOUNTER — Ambulatory Visit (INDEPENDENT_AMBULATORY_CARE_PROVIDER_SITE_OTHER): Payer: Federal, State, Local not specified - PPO | Admitting: Obstetrics and Gynecology

## 2019-04-21 ENCOUNTER — Other Ambulatory Visit (HOSPITAL_COMMUNITY)
Admission: RE | Admit: 2019-04-21 | Discharge: 2019-04-21 | Disposition: A | Discharge status: Home / Self Care | Payer: Federal, State, Local not specified - PPO | Source: Ambulatory Visit | Attending: Obstetrics and Gynecology | Admitting: Obstetrics and Gynecology

## 2019-04-21 VITALS — BP 128/84 | HR 68 | Temp 97.2°F | Ht 61.0 in | Wt 226.6 lb

## 2019-04-21 DIAGNOSIS — Z01419 Encounter for gynecological examination (general) (routine) without abnormal findings: Secondary | ICD-10-CM

## 2019-04-21 DIAGNOSIS — Z124 Encounter for screening for malignant neoplasm of cervix: Secondary | ICD-10-CM

## 2019-04-21 NOTE — Patient Instructions (Signed)
EXERCISE AND DIET:  We recommended that you start or continue a regular exercise program for good health. Regular exercise means any activity that makes your heart beat faster and makes you sweat.  We recommend exercising at least 30 minutes per day at least 3 days a week, preferably 4 or 5.  We also recommend a diet low in fat and sugar.  Inactivity, poor dietary choices and obesity can cause diabetes, heart attack, stroke, and kidney damage, among others.    ALCOHOL AND SMOKING:  Women should limit their alcohol intake to no more than 7 drinks/beers/glasses of wine (combined, not each!) per week. Moderation of alcohol intake to this level decreases your risk of breast cancer and liver damage. And of course, no recreational drugs are part of a healthy lifestyle.  And absolutely no smoking or even second hand smoke. Most people know smoking can cause heart and lung diseases, but did you know it also contributes to weakening of your bones? Aging of your skin?  Yellowing of your teeth and nails?  CALCIUM AND VITAMIN D:  Adequate intake of calcium and Vitamin D are recommended.  The recommendations for exact amounts of these supplements seem to change often, but generally speaking 1,000 mg of calcium (between diet and supplement) and 800 units of Vitamin D per day seems prudent. Certain women may benefit from higher intake of Vitamin D.  If you are among these women, your doctor will have told you during your visit.    PAP SMEARS:  Pap smears, to check for cervical cancer or precancers,  have traditionally been done yearly, although recent scientific advances have shown that most women can have pap smears less often.  However, every woman still should have a physical exam from her gynecologist every year. It will include a breast check, inspection of the vulva and vagina to check for abnormal growths or skin changes, a visual exam of the cervix, and then an exam to evaluate the size and shape of the uterus and  ovaries.  And after 53 years of age, a rectal exam is indicated to check for rectal cancers. We will also provide age appropriate advice regarding health maintenance, like when you should have certain vaccines, screening for sexually transmitted diseases, bone density testing, colonoscopy, mammograms, etc.   MAMMOGRAMS:  All women over 40 years old should have a yearly mammogram. Many facilities now offer a "3D" mammogram, which may cost around $50 extra out of pocket. If possible,  we recommend you accept the option to have the 3D mammogram performed.  It both reduces the number of women who will be called back for extra views which then turn out to be normal, and it is better than the routine mammogram at detecting truly abnormal areas.    COLON CANCER SCREENING: Now recommend starting at age 45. At this time colonoscopy is not covered for routine screening until 50. There are take home tests that can be done between 45-49.   COLONOSCOPY:  Colonoscopy to screen for colon cancer is recommended for all women at age 50.  We know, you hate the idea of the prep.  We agree, BUT, having colon cancer and not knowing it is worse!!  Colon cancer so often starts as a polyp that can be seen and removed at colonscopy, which can quite literally save your life!  And if your first colonoscopy is normal and you have no family history of colon cancer, most women don't have to have it again for   10 years.  Once every ten years, you can do something that may end up saving your life, right?  We will be happy to help you get it scheduled when you are ready.  Be sure to check your insurance coverage so you understand how much it will cost.  It may be covered as a preventative service at no cost, but you should check your particular policy.      Breast Self-Awareness Breast self-awareness means being familiar with how your breasts look and feel. It involves checking your breasts regularly and reporting any changes to your  health care provider. Practicing breast self-awareness is important. A change in your breasts can be a sign of a serious medical problem. Being familiar with how your breasts look and feel allows you to find any problems early, when treatment is more likely to be successful. All women should practice breast self-awareness, including women who have had breast implants. How to do a breast self-exam One way to learn what is normal for your breasts and whether your breasts are changing is to do a breast self-exam. To do a breast self-exam: Look for Changes  1. Remove all the clothing above your waist. 2. Stand in front of a mirror in a room with good lighting. 3. Put your hands on your hips. 4. Push your hands firmly downward. 5. Compare your breasts in the mirror. Look for differences between them (asymmetry), such as: ? Differences in shape. ? Differences in size. ? Puckers, dips, and bumps in one breast and not the other. 6. Look at each breast for changes in your skin, such as: ? Redness. ? Scaly areas. 7. Look for changes in your nipples, such as: ? Discharge. ? Bleeding. ? Dimpling. ? Redness. ? A change in position. Feel for Changes Carefully feel your breasts for lumps and changes. It is best to do this while lying on your back on the floor and again while sitting or standing in the shower or tub with soapy water on your skin. Feel each breast in the following way:  Place the arm on the side of the breast you are examining above your head.  Feel your breast with the other hand.  Start in the nipple area and make  inch (2 cm) overlapping circles to feel your breast. Use the pads of your three middle fingers to do this. Apply light pressure, then medium pressure, then firm pressure. The light pressure will allow you to feel the tissue closest to the skin. The medium pressure will allow you to feel the tissue that is a little deeper. The firm pressure will allow you to feel the tissue  close to the ribs.  Continue the overlapping circles, moving downward over the breast until you feel your ribs below your breast.  Move one finger-width toward the center of the body. Continue to use the  inch (2 cm) overlapping circles to feel your breast as you move slowly up toward your collarbone.  Continue the up and down exam using all three pressures until you reach your armpit.  Write Down What You Find  Write down what is normal for each breast and any changes that you find. Keep a written record with breast changes or normal findings for each breast. By writing this information down, you do not need to depend only on memory for size, tenderness, or location. Write down where you are in your menstrual cycle, if you are still menstruating. If you are having trouble noticing differences   in your breasts, do not get discouraged. With time you will become more familiar with the variations in your breasts and more comfortable with the exam. How often should I examine my breasts? Examine your breasts every month. If you are breastfeeding, the best time to examine your breasts is after a feeding or after using a breast pump. If you menstruate, the best time to examine your breasts is 5-7 days after your period is over. During your period, your breasts are lumpier, and it may be more difficult to notice changes. When should I see my health care provider? See your health care provider if you notice:  A change in shape or size of your breasts or nipples.  A change in the skin of your breast or nipples, such as a reddened or scaly area.  Unusual discharge from your nipples.  A lump or thick area that was not there before.  Pain in your breasts.  Anything that concerns you.  

## 2019-04-23 LAB — CYTOLOGY - PAP
Diagnosis: NEGATIVE
HPV: NOT DETECTED

## 2019-06-02 NOTE — Progress Notes (Signed)
[facility] - Clinic Note  06/04/2019     CHIEF COMPLAINT Patient presents for Retina Evaluation   HISTORY OF PRESENT ILLNESS: Samantha [name] is a 53 y.o. female who presents to the clinic today for:    HPI    Retina Evaluation    In both eyes.  Context:  distance vision.  I, the attending physician,  performed the HPI with the patient and updated documentation appropriately.          Comments    Patient states her vision is stable in both eyes.  She has to use a separate pair of glasses for computer.  Patient states her left eye vision has always been poor.  Was a premie and has worn glasses since age 89.  Patient denies eye pain or discomfort.  Patient denies any floaters or fol.       Last edited by Rennis ChrisZamora, Jeroline Wolbert, MD on 06/04/2019 11:42 PM. (History)    Patient states she has known about optic nerve edema since 2014--was told about this from [facility] eye doc [facility].  Patient states she has had an MRI for this as well.  Patient states she was a premie and has worn glasses since age 89.  Referring physician: Shon MilletGlenn, Steven J, MD [address] [place],  [place] [id number]  HISTORICAL INFORMATION:   Selected notes from the MEDICAL RECORD NUMBER Referred by Dr. Shari ProwsSteven Glenn for B-scan of optic nerve LEE: Gordy Councilman(S. Glenn) [BCVA: 20/30- OU) Ocular Hx-amblyopia, papilledema   PMH-DM (A1c: 6.4, takes metformin), HTN, HLD   CURRENT MEDICATIONS: No current outpatient medications on file. (Ophthalmic Drugs)   No current facility-administered medications for this visit.  (Ophthalmic Drugs)   Current Outpatient Medications (Other)  Medication Sig  . b complex vitamins tablet Take 1 tablet by mouth daily.  . Calcium Carbonate (CALTRATE 600 PO) Take 600 mg by mouth daily.   . cetirizine (ZYRTEC) 10 MG tablet Take 10 mg by mouth daily.  Marland Kitchen. levothyroxine (SYNTHROID, LEVOTHROID) 50 MCG tablet Take 1 tablet by mouth daily before breakfast.  .  losartan-hydrochlorothiazide (HYZAAR) 100-25 MG tablet Take 1 tablet by mouth daily.  Marland Kitchen. LYRICA 225 MG capsule Take 225 mg by mouth 2 (two) times daily.  . metFORMIN (GLUCOPHAGE) 500 MG tablet Take 1 tablet by mouth 2 (two) times daily.  . Multiple Vitamin (MULTI-VITAMINS) TABS Take 1 tablet by mouth daily.  . pravastatin (PRAVACHOL) 80 MG tablet Take 1 tablet by mouth daily.   No current facility-administered medications for this visit.  (Other)      REVIEW OF SYSTEMS: ROS    Positive for: Endocrine, Eyes   Negative for: Constitutional, Gastrointestinal, Neurological, Skin, Genitourinary, Musculoskeletal, HENT, Cardiovascular, Respiratory, Psychiatric, Allergic/Imm, Heme/Lymph   Last edited by Corrinne EagleEnglish, Ashley L on 06/04/2019  8:49 AM. (History)       ALLERGIES Allergies  Allergen Reactions  . Lac Bovis Itching  . Other Itching    Bananas and Grapes  . Latex Hives  . Lisinopril Cough    PAST MEDICAL HISTORY Past Medical History:  Diagnosis Date  . Allergy   . Constipation   . Diabetes mellitus without complication (HCC) 09/2013   takes Metformin daily  . Diarrhea   . Dysphagia   . GERD (gastroesophageal reflux disease)    takes Omeprazole daily  . H/O hiatal hernia   . Heart murmur   . Hyperlipidemia    but no meds required   . Hypertension  takes HCTZ daily  . Hyperthyroidism    takes [employer] Throid daily  . Peripheral neuropathy    takes Lyrica daily  . Sleep apnea    uses CPAP-study done in Dec 2014-to request report from [facility] in [place]   Past Surgical History:  Procedure Laterality Date  . CHOLECYSTECTOMY    . ESOPHAGOGASTRODUODENOSCOPY    . ESSURE TUBAL LIGATION  11/2006   one tube still open  . INSERTION OF MESH N/A 12/14/2013   Procedure: INSERTION OF MESH;  Surgeon: Robyne AskewPaul S Toth III, MD;  [facility];  Service: General;  Laterality: N/A;  . TCS    . VENTRAL HERNIA REPAIR N/A 12/14/2013   Procedure: LAPAROSCOPIC VENTRAL HERNIA;   Surgeon: Robyne AskewPaul S Toth III, MD;  Location: [facility] OR;  Service: General;  Laterality: N/A;    FAMILY HISTORY Family History  Problem Relation Age of Onset  . Multiple births Mother   . Hypertension Mother   . Hypertension Father   . Heart disease Father   . Heart attack Father   . Diabetes Brother   . Hypertension Brother   . Thyroid disease Paternal Grandmother   . Hypertension Brother   . Hypertension Sister   . Thyroid disease Paternal Aunt   . Osteoporosis Other   . Breast cancer Neg Hx     SOCIAL HISTORY Social History   Tobacco Use  . Smoking status: Never Smoker  . Smokeless tobacco: Never Used  Substance Use Topics  . Alcohol use: No  . Drug use: No         OPHTHALMIC EXAM:  Base Eye Exam    Visual Acuity (Snellen - Linear)      Right Left   Dist cc 20/20 -3 20/40 -1   Dist ph cc  20/30 -2   Correction: Glasses       Tonometry (Tonopen, 8:21 AM)      Right Left   Pressure 23 19       Pupils      Dark Light Shape React APD   Right 4 3 Round Brisk 0   Left 4 3 Round Brisk 0       Visual Fields      Left Right    Full Full       Extraocular Movement      Right Left    Full Full       Neuro/Psych    Oriented x3: Yes   Mood/Affect: Normal       Dilation    Both eyes: 1.0% Mydriacyl, 2.5% Phenylephrine @ 8:21 AM        Slit Lamp and Fundus Exam    Slit Lamp Exam      Right Left   Lids/Lashes Dermatochalasis - upper lid Dermatochalasis - upper lid   Conjunctiva/Sclera White and quiet White and quiet   Cornea 1+ PEE 1+ PEE   Anterior Chamber Deep and quiet Deep and quiet   Iris Round and dilated, neg NVI Round and dilated, neg NVI   Lens 1+ Nuclear sclerosis, 2+ Cortical cataract 1+ Nuclear sclerosis, 2+ Cortical cataract   Vitreous mild Vitreous syneresis mild Vitreous syneresis       Fundus Exam      Right Left   Disc mild elevation superiorly, no obscuration of vessels, otherwise sharp rim 300 degrees of disc elevation, temporal  sector spared, mild blurring of margins, no obscuration of vessels   C/D Ratio 0.3 0.2   Macula Flat, Good  foveal reflex, mild RPE mottling, no heme or edema Flat, Good foveal reflex, mild RPE mottling, No Heme or Edema   Vessels Tortuous superior greater than inferior,mild Vascular attenuation Vascular attenuation, Tortuous   Periphery Attached, no heme Attached, no heme        Refraction    Wearing Rx      Sphere Cylinder Axis Add   Right +1.75 +1.25 177 +2.50   Left +3.00 Sphere  +2.50   Age: prog   Type: 2 years          IMAGING AND PROCEDURES  Imaging and Procedures for @TODAY @  OCT, Retina - OU - Both Eyes       Right Eye Quality was good. Central Foveal Thickness: 300. Progression has no prior data. Findings include normal foveal contour, no IRF, no SRF.   Left Eye Quality was good. Central Foveal Thickness: 285. Progression has no prior data.   Notes *Images captured and stored on drive  Diagnosis / Impression:  NFP; no IRF/SRF OU Mild elevation of optic disc OU    Clinical management:  See below  Abbreviations: NFP - Normal foveal profile. CME - cystoid macular edema. PED - pigment epithelial detachment. IRF - intraretinal fluid. SRF - subretinal fluid. EZ - ellipsoid zone. ERM - epiretinal membrane. ORA - outer retinal atrophy. ORT - outer retinal tubulation. SRHM - subretinal hyper-reflective material        B-Scan Ultrasound - OS - Left Eye       Quality was good.   Notes **Images stored on drive**  Impression: Focal hyperechoic signal at optic nerve head OU -- consistent with optic disc drusen                ASSESSMENT/PLAN:    ICD-10-CM   1. Drusen of both optic discs  H47.323 B-Scan Ultrasound - OS - Left Eye  2. Retinal edema  H35.81 OCT, Retina - OU - Both Eyes  3. Essential hypertension  I10   4. Hypertensive retinopathy of both eyes  H35.033   5. Diabetes mellitus type 2 without retinopathy ([place])  E11.9   6. Amblyopia,  left  H53.002     1. Optic disc drusen OU - mild segmental elevation of disc OU (OS > OD) - b-scan ultrasound 7.30.2020: focal hyperechoic signal at optic nerve head OU consistent with optic disc drusen - discussed findings, prognosis - recommend continued monitoring under the expert care of Dr. Eulas Post - f/u here prn  2. No retinal edema on exam or OCT  3,4. Hypertensive retinopathy OU - discussed importance of tight BP control - monitor  5. Diabetes mellitus, type 2 without retinopathy - The incidence, risk factors for progression, natural history and treatment options for diabetic retinopathy  were discussed with patient.   - The need for close monitoring of blood glucose, blood pressure, and serum lipids, avoiding cigarette or any type of tobacco, and the need for long term follow up was also discussed with patient.  6. Amblyopia OS - pt reports long history of OS being weaker eye since early childhood - mild-- BCVA 20/30 - monitor  7. 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 - discussed diagnosis and progression - under the expert management of Dr. Eulas Post   Ophthalmic Meds Ordered this visit:  No orders of the defined types were placed in this encounter.      Return if symptoms worsen or fail  to improve.  There are no Patient Instructions on file for this visit.   Explained the diagnoses, plan, and follow up with the patient and they expressed understanding.  Patient expressed understanding of the importance of proper follow up care.   This document serves as a record of services personally performed by Karie ChimeraBrian G. Erminio Nygard, MD, PhD. It was created on their behalf by Laurian BrimAmanda Brown, OA, an ophthalmic assistant. The creation of this record is the provider's dictation and/or activities during the visit.    Electronically signed by: Laurian BrimAmanda Brown, OA  07.28.2020 11:45 PM    Karie ChimeraBrian  G. Zamarah Ullmer, M.D., Ph.D. Diseases & Surgery of the Retina and Vitreous [facility]  I have reviewed the above documentation for accuracy and completeness, and I agree with the above. Karie ChimeraBrian G. Geniva Lohnes, M.D., Ph.D. 06/04/19 11:50 PM    Abbreviations: M myopia (nearsighted); A astigmatism; H hyperopia (farsighted); P presbyopia; Mrx spectacle prescription;  CTL contact lenses; OD right eye; OS left eye; OU both eyes  XT exotropia; ET esotropia; PEK punctate epithelial keratitis; PEE punctate epithelial erosions; DES dry eye syndrome; MGD meibomian gland dysfunction; ATs artificial tears; PFAT's preservative free artificial tears; NSC nuclear sclerotic cataract; PSC posterior subcapsular cataract; ERM epi-retinal membrane; PVD posterior vitreous detachment; RD retinal detachment; DM diabetes mellitus; DR diabetic retinopathy; NPDR non-proliferative diabetic retinopathy; PDR proliferative diabetic retinopathy; CSME clinically significant macular edema; DME diabetic macular edema; dbh dot blot hemorrhages; CWS cotton wool spot; POAG primary open angle glaucoma; C/D cup-to-disc ratio; HVF humphrey visual field; GVF goldmann visual field; OCT optical coherence tomography; IOP intraocular pressure; BRVO Branch retinal vein occlusion; CRVO central retinal vein occlusion; CRAO central retinal artery occlusion; BRAO branch retinal artery occlusion; RT retinal tear; SB scleral buckle; PPV pars plana vitrectomy; VH Vitreous hemorrhage; PRP panretinal laser photocoagulation; IVK intravitreal kenalog; VMT vitreomacular traction; MH Macular hole;  NVD neovascularization of the disc; NVE neovascularization elsewhere; AREDS age related eye disease study; ARMD age related macular degeneration; POAG primary open angle glaucoma; EBMD epithelial/anterior basement membrane dystrophy; ACIOL anterior chamber intraocular lens; IOL intraocular lens; PCIOL posterior chamber intraocular lens; Phaco/IOL  phacoemulsification with intraocular lens placement; PRK photorefractive keratectomy; LASIK laser assisted in situ keratomileusis; HTN hypertension; DM diabetes mellitus; COPD chronic obstructive pulmonary disease

## 2019-06-03 ENCOUNTER — Encounter (INDEPENDENT_AMBULATORY_CARE_PROVIDER_SITE_OTHER): Payer: Federal, State, Local not specified - PPO | Admitting: Ophthalmology

## 2019-06-04 ENCOUNTER — Encounter (INDEPENDENT_AMBULATORY_CARE_PROVIDER_SITE_OTHER): Payer: Self-pay | Admitting: Ophthalmology

## 2019-06-04 ENCOUNTER — Other Ambulatory Visit: Payer: Self-pay

## 2019-06-04 ENCOUNTER — Ambulatory Visit (INDEPENDENT_AMBULATORY_CARE_PROVIDER_SITE_OTHER): Payer: Federal, State, Local not specified - PPO | Admitting: Ophthalmology

## 2019-06-04 DIAGNOSIS — E119 Type 2 diabetes mellitus without complications: Secondary | ICD-10-CM

## 2019-06-04 DIAGNOSIS — I10 Essential (primary) hypertension: Secondary | ICD-10-CM

## 2019-06-04 DIAGNOSIS — H47323 Drusen of optic disc, bilateral: Secondary | ICD-10-CM | POA: Diagnosis not present

## 2019-06-04 DIAGNOSIS — H25813 Combined forms of age-related cataract, bilateral: Secondary | ICD-10-CM

## 2019-06-04 DIAGNOSIS — H35033 Hypertensive retinopathy, bilateral: Secondary | ICD-10-CM

## 2019-06-04 DIAGNOSIS — H53002 Unspecified amblyopia, left eye: Secondary | ICD-10-CM

## 2019-06-04 DIAGNOSIS — H3581 Retinal edema: Secondary | ICD-10-CM

## 2019-08-01 ENCOUNTER — Other Ambulatory Visit: Payer: Self-pay

## 2019-08-01 DIAGNOSIS — Z20822 Contact with and (suspected) exposure to covid-19: Secondary | ICD-10-CM

## 2019-08-02 LAB — NOVEL CORONAVIRUS, NAA: SARS-CoV-2, NAA: NOT DETECTED

## 2019-11-30 ENCOUNTER — Other Ambulatory Visit: Payer: Self-pay | Admitting: Obstetrics and Gynecology

## 2019-11-30 DIAGNOSIS — Z1231 Encounter for screening mammogram for malignant neoplasm of breast: Secondary | ICD-10-CM

## 2019-12-26 ENCOUNTER — Other Ambulatory Visit: Payer: Self-pay

## 2019-12-26 DIAGNOSIS — Z20822 Contact with and (suspected) exposure to covid-19: Secondary | ICD-10-CM

## 2019-12-27 LAB — NOVEL CORONAVIRUS, NAA: SARS-CoV-2, NAA: NOT DETECTED

## 2020-01-25 ENCOUNTER — Other Ambulatory Visit: Payer: Self-pay | Admitting: Cardiology

## 2020-01-25 DIAGNOSIS — Z20822 Contact with and (suspected) exposure to covid-19: Secondary | ICD-10-CM

## 2020-01-26 LAB — SARS-COV-2, NAA 2 DAY TAT

## 2020-01-26 LAB — NOVEL CORONAVIRUS, NAA: SARS-CoV-2, NAA: NOT DETECTED

## 2020-03-16 ENCOUNTER — Other Ambulatory Visit: Payer: Self-pay

## 2020-03-16 ENCOUNTER — Ambulatory Visit
Admission: RE | Admit: 2020-03-16 | Discharge: 2020-03-16 | Disposition: A | Discharge status: Home / Self Care | Payer: Federal, State, Local not specified - PPO | Source: Ambulatory Visit | Attending: Obstetrics and Gynecology | Admitting: Obstetrics and Gynecology

## 2020-03-16 DIAGNOSIS — Z1231 Encounter for screening mammogram for malignant neoplasm of breast: Secondary | ICD-10-CM

## 2020-05-03 NOTE — Progress Notes (Signed)
54 y.o. [id number] Married Black or [place] American Not Hispanic or Latino female here for annual exam.  PMP, no bleeding. No dyspareunia. She recently started having vasomotor symptoms, tolerable. No vaginal dryness.     She has occasional urge incontinence if she is over full, small amount. Kegels help. Tolerable. No GSI.   No LMP recorded. Patient has had an implant.          Sexually active: Yes.    The current method of family planning is ensure.    Exercising: Yes.    walking Smoker:  no  Health Maintenance: Pap:04/21/19 WNL Hr HPV Neg,  12-23-15 WNL NEG HR HPV History of abnormal Pap:Yes- repeat PAP normal per patient MMG:  03/16/20  BMD:   Never  Colonoscopy:2016 normal per patient, f/u in 10 years.  TDaP:  11/24/12 Gardasil:  NA    reports that she has never smoked. She has never used smokeless tobacco. She reports that she does not drink alcohol and does not use drugs. 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 Daughter is 62, starting OT grad school in August.  Past Medical History:  Diagnosis Date  . Allergy   . Constipation   . Diabetes mellitus without complication (HCC) 09/2013   takes Metformin daily  . Diarrhea   . Dysphagia   . GERD (gastroesophageal reflux disease)    takes Omeprazole daily  . H/O hiatal hernia   . Heart murmur   . Hyperlipidemia    but no meds required   . Hypertension    takes HCTZ daily  . Hyperthyroidism    takes [employer] daily  . Peripheral neuropathy    takes Lyrica daily  . Sleep apnea    uses CPAP-study done in Dec 2014-to request report from [facility] in [place]    Past Surgical History:  Procedure Laterality Date  . CHOLECYSTECTOMY    . ESOPHAGOGASTRODUODENOSCOPY    . ESSURE TUBAL LIGATION  11/2006   one tube still open  . INSERTION OF MESH N/A 12/14/2013   Procedure: INSERTION OF MESH;  Surgeon: Robyne Askew, MD;  Location: [facility] OR;  Service: General;  Laterality: N/A;  . TCS    . VENTRAL  HERNIA REPAIR N/A 12/14/2013   Procedure: LAPAROSCOPIC VENTRAL HERNIA;  Surgeon: Robyne Askew, MD;  Location: [facility] OR;  Service: General;  Laterality: N/A;    Current Outpatient Medications  Medication Sig Dispense Refill  . b complex vitamins tablet Take 1 tablet by mouth daily.    . Calcium Carbonate (CALTRATE 600 PO) Take 600 mg by mouth daily.     . cetirizine (ZYRTEC) 10 MG tablet Take 10 mg by mouth daily.    Marland Kitchen levothyroxine (SYNTHROID, LEVOTHROID) 50 MCG tablet Take 1 tablet by mouth daily before breakfast.  1  . losartan-hydrochlorothiazide (HYZAAR) 100-25 MG tablet Take 1 tablet by mouth daily.  5  . LYRICA 225 MG capsule Take 225 mg by mouth 2 (two) times daily.  5  . metFORMIN (GLUCOPHAGE) 500 MG tablet Take 1 tablet by mouth 2 (two) times daily.  3  . Multiple Vitamin (MULTI-VITAMINS) TABS Take 1 tablet by mouth daily.    . pravastatin (PRAVACHOL) 80 MG tablet Take 1 tablet by mouth daily.  3   No current facility-administered medications for this visit.    Family History  Problem Relation Age of Onset  . Multiple births Mother   . Hypertension Mother   . Hypertension Father   .  Heart disease Father   . Heart attack Father   . Diabetes Brother   . Hypertension Brother   . Thyroid disease Paternal Grandmother   . Hypertension Brother   . Hypertension Sister   . Thyroid disease Paternal Aunt   . Osteoporosis Other   . Breast cancer Neg Hx     Review of Systems  All other systems reviewed and are negative.   Exam:   BP 112/74   Pulse 77   Temp 98.3 F (36.8 C)   Ht 5' 0.75" (1.543 m)   Wt 231 lb (104.8 kg)   SpO2 98%   BMI 44.01 kg/m   Weight change: @WEIGHTCHANGE @ Height:   Height: 5' 0.75" (154.3 cm)  Ht Readings from Last 3 Encounters:  05/04/20 5' 0.75" (1.543 m)  04/21/19 5\' 1"  (1.549 m)  01/09/18 5' 0.5" (1.537 m)    General appearance: alert, cooperative and appears stated age Head: Normocephalic, without obvious abnormality, atraumatic Neck:  no adenopathy, supple, symmetrical, trachea midline and thyroid normal to inspection and palpation Lungs: clear to auscultation bilaterally Cardiovascular: regular rate and rhythm Breasts: normal appearance, no masses or tenderness Abdomen: soft, non-tender; non distended,  no masses,  no organomegaly Extremities: extremities normal, atraumatic, no cyanosis or edema Skin: Skin color, texture, turgor normal. No rashes or lesions Lymph nodes: Cervical, supraclavicular, and axillary nodes normal. No abnormal inguinal nodes palpated Neurologic: Grossly normal   Pelvic: External genitalia:  no lesions              Urethra:  normal appearing urethra with no masses, tenderness or lesions              Bartholins and Skenes: normal                 Vagina: normal appearing vagina with normal color and discharge, no lesions              Cervix: no lesions               Bimanual Exam:  Uterus:  no masses or tenderness              Adnexa: no mass, fullness, tenderness               Rectovaginal: Confirms               Anus:  normal sphincter tone, no lesions  chaperoned for the exam.  A:  Well Woman with normal exam  BMI 44, discussed weight loss.   P:   Pap up to date  Mammogram and colonoscopy are up to date  Discussed breast self exam  Discussed calcium and vit D intake  Medical issues managed by her primary.

## 2020-05-04 ENCOUNTER — Encounter: Payer: Self-pay | Admitting: Obstetrics and Gynecology

## 2020-05-04 ENCOUNTER — Other Ambulatory Visit: Payer: Self-pay

## 2020-05-04 ENCOUNTER — Ambulatory Visit (INDEPENDENT_AMBULATORY_CARE_PROVIDER_SITE_OTHER): Payer: Federal, State, Local not specified - PPO | Admitting: Obstetrics and Gynecology

## 2020-05-04 VITALS — BP 112/74 | HR 77 | Temp 98.3°F | Ht 60.75 in | Wt 231.0 lb

## 2020-05-04 DIAGNOSIS — Z6841 Body Mass Index (BMI) 40.0 and over, adult: Secondary | ICD-10-CM

## 2020-05-04 DIAGNOSIS — Z01419 Encounter for gynecological examination (general) (routine) without abnormal findings: Secondary | ICD-10-CM

## 2020-05-04 DIAGNOSIS — N951 Menopausal and female climacteric states: Secondary | ICD-10-CM

## 2020-05-04 NOTE — Patient Instructions (Addendum)
Leg Cramps Leg cramps occur when one or more muscles tighten and you have no control over this tightening (involuntary muscle contraction). Muscle cramps can develop in any muscle, but the most common place is in the calf muscles of the leg. Those cramps can occur during exercise or when you are at rest. Leg cramps are painful, and they may last for a few seconds to a few minutes. Cramps may return several times before they finally stop. Usually, leg cramps are not caused by a serious medical problem. In many cases, the cause is not known. Some common causes include:  Excessive physical effort (overexertion), such as during intense exercise.  Overuse from repetitive motions, or doing the same thing over and over.  Staying in a certain position for a long period of time.  Improper preparation, form, or technique while performing a sport or an activity.  Dehydration.  Injury.  Side effects of certain medicines.  Abnormally low levels of minerals in your blood (electrolytes), especially potassium and calcium. This could result from: ? Pregnancy. ? Taking diuretic medicines. Follow these instructions at home: Eating and drinking  Drink enough fluid to keep your urine pale yellow. Staying hydrated may help prevent cramps.  Eat a healthy diet that includes plenty of nutrients to help your muscles function. A healthy diet includes fruits and vegetables, lean protein, whole grains, and low-fat or nonfat dairy products. Managing pain, stiffness, and swelling      Try massaging, stretching, and relaxing the affected muscle. Do this for several minutes at a time.  If directed, put ice on areas that are sore or painful after a cramp: ? Put ice in a plastic bag. ? Place a towel between your skin and the bag. ? Leave the ice on for 20 minutes, 2-3 times a day.  If directed, apply heat to muscles that are tense or tight. Do this before you exercise, or as often as told by your health care  provider. Use the heat source that your health care provider recommends, such as a moist heat pack or a heating pad. ? Place a towel between your skin and the heat source. ? Leave the heat on for 20-30 minutes. ? Remove the heat if your skin turns bright red. This is especially important if you are unable to feel pain, heat, or cold. You may have a greater risk of getting burned.  Try taking hot showers or baths to help relax tight muscles. General instructions  If you are having frequent leg cramps, avoid intense exercise for several days.  Take over-the-counter and prescription medicines only as told by your health care provider.  Keep all follow-up visits as told by your health care provider. This is important. Contact a health care provider if:  Your leg cramps get more severe or more frequent, or they do not improve over time.  Your foot becomes cold, numb, or blue. Summary  Muscle cramps can develop in any muscle, but the most common place is in the calf muscles of the leg.  Leg cramps are painful, and they may last for a few seconds to a few minutes.  Usually, leg cramps are not caused by a serious medical problem. Often, the cause is not known.  Stay hydrated and take over-the-counter and prescription medicines only as told by your health care provider. This information is not intended to replace advice given to you by your health care provider. Make sure you discuss any questions you have with your health care  provider. Document Revised: 10/04/2017 Document Reviewed: 08/01/2017 Elsevier Patient Education  2020 Else[employer] Inc[employer]   EXERCISE AND DIET:  We recommended that you start or continue a regular exercise program for good health. Regular exercise means any activity that makes your heart beat faster and makes you sweat.  We recommend exercising at least 30 minutes per day at least 3 days a week, preferably 4 or 5.  We also recommend a diet low in fat and sugar.  Inactivity,  poor dietary choices and obesity can cause diabetes, heart attack, stroke, and kidney damage, among others.    ALCOHOL AND SMOKING:  Women should limit their alcohol intake to no more than 7 drinks/beers/glasses of wine (combined, not each!) per week. Moderation of alcohol intake to this level decreases your risk of breast cancer and liver damage. And of course, no recreational drugs are part of a healthy lifestyle.  And absolutely no smoking or even second hand smoke. Most people know smoking can cause heart and lung diseases, but did you know it also contributes to weakening of your bones? Aging of your skin?  Yellowing of your teeth and nails?  CALCIUM AND VITAMIN D:  Adequate intake of calcium and Vitamin D are recommended.  The recommendations for exact amounts of these supplements seem to change often, but generally speaking 1,200 mg of calcium (between diet and supplement) and 800 units of Vitamin D per day seems prudent. Certain women may benefit from higher intake of Vitamin D.  If you are among these women, your doctor will have told you during your visit.    PAP SMEARS:  Pap smears, to check for cervical cancer or precancers,  have traditionally been done yearly, although recent scientific advances have shown that most women can have pap smears less often.  However, every woman still should have a physical exam from her gynecologist every year. It will include a breast check, inspection of the vulva and vagina to check for abnormal growths or skin changes, a visual exam of the cervix, and then an exam to evaluate the size and shape of the uterus and ovaries.  And after 54 years of age, a rectal exam is indicated to check for rectal cancers. We will also provide age appropriate advice regarding health maintenance, like when you should have certain vaccines, screening for sexually transmitted diseases, bone density testing, colonoscopy, mammograms, etc.   MAMMOGRAMS:  All women over 20 years old  should have a yearly mammogram. Many facilities now offer a "3D" mammogram, which may cost around $50 extra out of pocket. If possible,  we recommend you accept the option to have the 3D mammogram performed.  It both reduces the number of women who will be called back for extra views which then turn out to be normal, and it is better than the routine mammogram at detecting truly abnormal areas.    COLON CANCER SCREENING: Now recommend starting at age 34. At this time colonoscopy is not covered for routine screening until 50. There are take home tests that can be done between 45-49.   COLONOSCOPY:  Colonoscopy to screen for colon cancer is recommended for all women at age 53.  We know, you hate the idea of the prep.  We agree, BUT, having colon cancer and not knowing it is worse!!  Colon cancer so often starts as a polyp that can be seen and removed at colonscopy, which can quite literally save your life!  And if your first colonoscopy is normal and  you have no family history of colon cancer, most women don't have to have it again for 10 years.  Once every ten years, you can do something that may end up saving your life, right?  We will be happy to help you get it scheduled when you are ready.  Be sure to check your insurance coverage so you understand how much it will cost.  It may be covered as a preventative service at no cost, but you should check your particular policy.      Breast Self-Awareness Breast self-awareness means being familiar with how your breasts look and feel. It involves checking your breasts regularly and reporting any changes to your health care provider. Practicing breast self-awareness is important. A change in your breasts can be a sign of a serious medical problem. Being familiar with how your breasts look and feel allows you to find any problems early, when treatment is more likely to be successful. All women should practice breast self-awareness, including women who have had  breast implants. How to do a breast self-exam One way to learn what is normal for your breasts and whether your breasts are changing is to do a breast self-exam. To do a breast self-exam: Look for Changes  1. Remove all the clothing above your waist. 2. Stand in front of a mirror in a room with good lighting. 3. Put your hands on your hips. 4. Push your hands firmly downward. 5. Compare your breasts in the mirror. Look for differences between them (asymmetry), such as: ? Differences in shape. ? Differences in size. ? Puckers, dips, and bumps in one breast and not the other. 6. Look at each breast for changes in your skin, such as: ? Redness. ? Scaly areas. 7. Look for changes in your nipples, such as: ? Discharge. ? Bleeding. ? Dimpling. ? Redness. ? A change in position. Feel for Changes Carefully feel your breasts for lumps and changes. It is best to do this while lying on your back on the floor and again while sitting or standing in the shower or tub with soapy water on your skin. Feel each breast in the following way:  Place the arm on the side of the breast you are examining above your head.  Feel your breast with the other hand.  Start in the nipple area and make  inch (2 cm) overlapping circles to feel your breast. Use the pads of your three middle fingers to do this. Apply light pressure, then medium pressure, then firm pressure. The light pressure will allow you to feel the tissue closest to the skin. The medium pressure will allow you to feel the tissue that is a little deeper. The firm pressure will allow you to feel the tissue close to the ribs.  Continue the overlapping circles, moving downward over the breast until you feel your ribs below your breast.  Move one finger-width toward the center of the body. Continue to use the  inch (2 cm) overlapping circles to feel your breast as you move slowly up toward your collarbone.  Continue the up and down exam using all  three pressures until you reach your armpit.  Write Down What You Find  Write down what is normal for each breast and any changes that you find. Keep a written record with breast changes or normal findings for each breast. By writing this information down, you do not need to depend only on memory for size, tenderness, or location. Write down where you  are in your menstrual cycle, if you are still menstruating. If you are having trouble noticing differences in your breasts, do not get discouraged. With time you will become more familiar with the variations in your breasts and more comfortable with the exam. How often should I examine my breasts? Examine your breasts every month. If you are breastfeeding, the best time to examine your breasts is after a feeding or after using a breast pump. If you menstruate, the best time to examine your breasts is 5-7 days after your period is over. During your period, your breasts are lumpier, and it may be more difficult to notice changes. When should I see my health care provider? See your health care provider if you notice:  A change in shape or size of your breasts or nipples.  A change in the skin of your breast or nipples, such as a reddened or scaly area.  Unusual discharge from your nipples.  A lump or thick area that was not there before.  Pain in your breasts.  Anything that concerns you.  Menopause Menopause is the normal time of life when menstrual periods stop completely. It is usually confirmed by 12 months without a menstrual period. The transition to menopause (perimenopause) most often happens between the ages of 34 and 59. During perimenopause, hormone levels change in your body, which can cause symptoms and affect your health. Menopause may increase your risk for:  Loss of bone (osteoporosis), which causes bone breaks (fractures).  Depression.  Hardening and narrowing of the arteries (atherosclerosis), which can cause heart attacks  and strokes. What are the causes? This condition is usually caused by a natural change in hormone levels that happens as you get older. The condition may also be caused by surgery to remove both ovaries (bilateral oophorectomy). What increases the risk? This condition is more likely to start at an earlier age if you have certain medical conditions or treatments, including:  A tumor of the pituitary gland in the brain.  A disease that affects the ovaries and hormone production.  Radiation treatment for cancer.  Certain cancer treatments, such as chemotherapy or hormone (anti-estrogen) therapy.  Heavy smoking and excessive alcohol use.  Family history of early menopause. This condition is also more likely to develop earlier in women who are very thin. What are the signs or symptoms? Symptoms of this condition include:  Hot flashes.  Irregular menstrual periods.  Night sweats.  Changes in feelings about sex. This could be a decrease in sex drive or an increased comfort around your sexuality.  Vaginal dryness and thinning of the vaginal walls. This may cause painful intercourse.  Dryness of the skin and development of wrinkles.  Headaches.  Problems sleeping (insomnia).  Mood swings or irritability.  Memory problems.  Weight gain.  Hair growth on the face and chest.  Bladder infections or problems with urinating. How is this diagnosed? This condition is diagnosed based on your medical history, a physical exam, your age, your menstrual history, and your symptoms. Hormone tests may also be done. How is this treated? In some cases, no treatment is needed. You and your health care provider should make a decision together about whether treatment is necessary. Treatment will be based on your individual condition and preferences. Treatment for this condition focuses on managing symptoms. Treatment may include:  Menopausal hormone therapy (MHT).  Medicines to treat specific  symptoms or complications.  Acupuncture.  Vitamin or herbal supplements. Before starting treatment, make sure to let  your health care provider know if you have a personal or family history of:  Heart disease.  Breast cancer.  Blood clots.  Diabetes.  Osteoporosis. Follow these instructions at home: Lifestyle  Do not use any products that contain nicotine or tobacco, such as cigarettes and e-cigarettes. If you need help quitting, ask your health care provider.  Get at least 30 minutes of physical activity on 5 or more days each week.  Avoid alcoholic and caffeinated beverages, as well as spicy foods. This may help prevent hot flashes.  Get 7-8 hours of sleep each night.  If you have hot flashes, try: ? Dressing in layers. ? Avoiding things that may trigger hot flashes, such as spicy food, warm places, or stress. ? Taking slow, deep breaths when a hot flash starts. ? Keeping a fan in your home and office.  Find ways to manage stress, such as deep breathing, meditation, or journaling.  Consider going to group therapy with other women who are having menopause symptoms. Ask your health care provider about recommended group therapy meetings. Eating and drinking  Eat a healthy, balanced diet that contains whole grains, lean protein, low-fat dairy, and plenty of fruits and vegetables.  Your health care provider may recommend adding more soy to your diet. Foods that contain soy include tofu, tempeh, and soy milk.  Eat plenty of foods that contain calcium and vitamin D for bone health. Items that are rich in calcium include low-fat milk, yogurt, beans, almonds, sardines, broccoli, and kale. Medicines  Take over-the-counter and prescription medicines only as told by your health care provider.  Talk with your health care provider before starting any herbal supplements. If prescribed, take vitamins and supplements as told by your health care provider. These may include: ? Calcium.  Women age 54 and older should get 1,200 mg (milligrams) of calcium every day. ? Vitamin D. Women need 600-800 International Units of vitamin D each day. ? Vitamins B12 and B6. Aim for 50 micrograms of B12 and 1.5 mg of B6 each day. General instructions  Keep track of your menstrual periods, including: ? When they occur. ? How heavy they are and how long they last. ? How much time passes between periods.  Keep track of your symptoms, noting when they start, how often you have them, and how long they last.  Use vaginal lubricants or moisturizers to help with vaginal dryness and improve comfort during sex.  Keep all follow-up visits as told by your health care provider. This is important. This includes any group therapy or counseling. Contact a health care provider if:  You are still having menstrual periods after age 46.  You have pain during sex.  You have not had a period for 12 months and you develop vaginal bleeding. Get help right away if:  You have: ? Severe depression. ? Excessive vaginal bleeding. ? Pain when you urinate. ? A fast or irregular heart beat (palpitations). ? Severe headaches. ? Abdomen (abdominal) pain or severe indigestion.  You fell and you think you have a broken bone.  You develop leg or chest pain.  You develop vision problems.  You feel a lump in your breast. Summary  Menopause is the normal time of life when menstrual periods stop completely. It is usually confirmed by 12 months without a menstrual period.  The transition to menopause (perimenopause) most often happens between the ages of 60 and 44.  Symptoms can be managed through medicines, lifestyle changes, and complementary therapies such as  acupuncture.  Eat a balanced diet that is rich in nutrients to promote bone health and heart health and to manage symptoms during menopause. This information is not intended to replace advice given to you by your health care provider. Make sure you  discuss any questions you have with your health care provider. Document Revised: 10/04/2017 Document Reviewed: 11/24/2016 Elsevier Patient Education  2020 [employer].

## 2020-10-03 ENCOUNTER — Other Ambulatory Visit: Payer: Self-pay | Admitting: Physician Assistant

## 2020-10-03 DIAGNOSIS — R1310 Dysphagia, unspecified: Secondary | ICD-10-CM

## 2020-10-05 ENCOUNTER — Ambulatory Visit
Admission: RE | Admit: 2020-10-05 | Discharge: 2020-10-05 | Disposition: A | Discharge status: Home / Self Care | Payer: Federal, State, Local not specified - PPO | Source: Ambulatory Visit | Attending: Physician Assistant | Admitting: Physician Assistant

## 2020-10-05 DIAGNOSIS — R1310 Dysphagia, unspecified: Secondary | ICD-10-CM

## 2020-11-19 ENCOUNTER — Other Ambulatory Visit: Payer: Self-pay

## 2020-11-19 ENCOUNTER — Other Ambulatory Visit: Payer: Federal, State, Local not specified - PPO

## 2020-11-19 DIAGNOSIS — Z20822 Contact with and (suspected) exposure to covid-19: Secondary | ICD-10-CM

## 2020-11-22 LAB — NOVEL CORONAVIRUS, NAA: SARS-CoV-2, NAA: NOT DETECTED

## 2020-12-10 ENCOUNTER — Other Ambulatory Visit: Payer: Federal, State, Local not specified - PPO

## 2020-12-10 DIAGNOSIS — Z20822 Contact with and (suspected) exposure to covid-19: Secondary | ICD-10-CM

## 2020-12-11 LAB — SARS-COV-2, NAA 2 DAY TAT

## 2020-12-11 LAB — NOVEL CORONAVIRUS, NAA: SARS-CoV-2, NAA: NOT DETECTED

## 2021-01-25 ENCOUNTER — Other Ambulatory Visit: Payer: Self-pay | Admitting: Family Medicine

## 2021-01-25 DIAGNOSIS — Z1231 Encounter for screening mammogram for malignant neoplasm of breast: Secondary | ICD-10-CM

## 2021-02-20 ENCOUNTER — Ambulatory Visit: Payer: Federal, State, Local not specified - PPO | Attending: Critical Care Medicine

## 2021-02-20 DIAGNOSIS — Z20822 Contact with and (suspected) exposure to covid-19: Secondary | ICD-10-CM

## 2021-02-21 LAB — SARS-COV-2, NAA 2 DAY TAT

## 2021-02-21 LAB — NOVEL CORONAVIRUS, NAA: SARS-CoV-2, NAA: NOT DETECTED

## 2021-03-20 ENCOUNTER — Other Ambulatory Visit: Payer: Self-pay

## 2021-03-20 ENCOUNTER — Ambulatory Visit
Admission: RE | Admit: 2021-03-20 | Discharge: 2021-03-20 | Disposition: A | Discharge status: Home / Self Care | Payer: Federal, State, Local not specified - PPO | Source: Ambulatory Visit

## 2021-03-20 DIAGNOSIS — Z1231 Encounter for screening mammogram for malignant neoplasm of breast: Secondary | ICD-10-CM

## 2021-05-09 NOTE — Progress Notes (Signed)
55 y.o. G108P1011 Married Black or [place] American Not Hispanic or Latino female here for annual exam.     She stopped having her cycle at the time of her Essure tubal occlusion in 2008, only one tube was blocked. Never used any other contraception.   No vaginal bleeding. No significant vasomotor symptoms. No vasomotor symptoms.   H/O DM, last HgbA1C was 6.8 in 5/22.   She has urgency to void with small amounts. She voids 1-2 x at night, voiding small amounts. Leaks small amounts of urine daily, occurs ~ 1 x a day, occur with a full bladder only.  She drinks 8 oz of pepsi every am. Worse in the last year.  She reports a several month h/o a mild, intermittent ,poking pain in her LUQ. H/O ventral hernia repair. No nausea or emesis, no pain with eating.          Sexually active: Yes.    The current method of family planning is Esure .    Exercising: Yes.     Walking  Smoker:  no  Health Maintenance: Pap:  :04/21/19 WNL Hr HPV Neg,  12-23-15 WNL NEG HR HPV  History of abnormal Pap:  yes repeat was normal  MMG:  03/20/21 Density B Bi-rads 1 neg  BMD:   Never  Colonoscopy: :2016 normal per patient, f/u in 10 years.  TDaP:  11/24/12 Gardasil: NA   reports that she has never smoked. She has never used smokeless tobacco. She reports that she does not drink alcohol and does not use drugs. 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 Daughter is in grad school to be a OT.   Past Medical History:  Diagnosis Date   Allergy    Constipation    Diabetes mellitus without complication (HCC) 09/2013   takes Metformin daily   Diarrhea    Dysphagia    GERD (gastroesophageal reflux disease)    takes Omeprazole daily   H/O hiatal hernia    Heart murmur    Hyperlipidemia    but no meds required    Hypertension    takes HCTZ daily   Hyperthyroidism    takes Ashby Dawes Throid daily   Peripheral neuropathy    takes Lyrica daily   Sleep apnea    uses CPAP-study done in Dec 2014-to request  report from [facility] in [place]    Past Surgical History:  Procedure Laterality Date   CHOLECYSTECTOMY     ESOPHAGOGASTRODUODENOSCOPY     ESSURE TUBAL LIGATION  11/2006   one tube still open   INSERTION OF MESH N/A 12/14/2013   Procedure: INSERTION OF MESH;  Surgeon: Robyne Askew, MD;  Location: [facility] OR;  Service: General;  Laterality: N/A;   TCS     VENTRAL HERNIA REPAIR N/A 12/14/2013   Procedure: LAPAROSCOPIC VENTRAL HERNIA;  Surgeon: Robyne Askew, MD;  Location: [facility] OR;  Service: General;  Laterality: N/A;    Current Outpatient Medications  Medication Sig Dispense Refill   b complex vitamins tablet Take 1 tablet by mouth daily.     Calcium Carbonate (CALTRATE 600 PO) Take 600 mg by mouth daily.      cetirizine (ZYRTEC) 10 MG tablet Take 10 mg by mouth daily.     DULoxetine (CYMBALTA) 30 MG capsule Take by mouth.     Insulin Pen Needle 32G X 4 MM MISC Use with pen device as directed     levothyroxine (SYNTHROID, LEVOTHROID) 50 MCG tablet Take  1 tablet by mouth daily before breakfast.  1   losartan-hydrochlorothiazide (HYZAAR) 100-25 MG tablet Take 1 tablet by mouth daily.  5   LYRICA 225 MG capsule Take 225 mg by mouth 2 (two) times daily.  5   metFORMIN (GLUCOPHAGE) 500 MG tablet Take 1 tablet by mouth 2 (two) times daily.  3   Multiple Vitamin (MULTI-VITAMINS) TABS Take 1 tablet by mouth daily.     oxybutynin (DITROPAN XL) 5 MG 24 hr tablet Take 1 tablet (5 mg total) by mouth at bedtime. 30 tablet 1   pravastatin (PRAVACHOL) 80 MG tablet Take 1 tablet by mouth daily.  3   No current facility-administered medications for this visit.  She has not on ditropan, started today, chart up dated for vitals and ditropan was pulled in.   Family History  Problem Relation Age of Onset   Multiple births Mother    Hypertension Mother    Hypertension Father    Heart disease Father    Heart attack Father    Diabetes Brother    Hypertension Brother    Thyroid disease Paternal  Grandmother    Hypertension Brother    Hypertension Sister    Thyroid disease Paternal Aunt    Osteoporosis Other    Breast cancer Neg Hx     Review of Systems  All other systems reviewed and are negative.  Exam:   BP 130/74   Pulse 64   Ht 5\' 1"  (1.549 m)   Wt 234 lb (106.1 kg)   SpO2 100%   BMI 44.21 kg/m   Weight change: @WEIGHTCHANGE @ Height:   Height: 5\' 1"  (154.9 cm)  Ht Readings from Last 3 Encounters:  05/10/21 5\' 1"  (1.549 m)  05/04/20 5' 0.75" (1.543 m)  04/21/19 5\' 1"  (1.549 m)    General appearance: alert, cooperative and appears stated age Head: Normocephalic, without obvious abnormality, atraumatic Neck: no adenopathy, supple, symmetrical, trachea midline and thyroid normal to inspection and palpation Lungs: clear to auscultation bilaterally Cardiovascular: regular rate and rhythm Breasts: normal appearance, no masses or tenderness Abdomen: soft, non-tender; non distended,  no masses,  no organomegaly Extremities: extremities normal, atraumatic, no cyanosis or edema Skin: Skin color, texture, turgor normal. No rashes or lesions Lymph nodes: Cervical, supraclavicular, and axillary nodes normal. No abnormal inguinal nodes palpated Neurologic: Grossly normal   Pelvic: External genitalia:  no lesions              Urethra:  normal appearing urethra with no masses, tenderness or lesions              Bartholins and Skenes: normal                 Vagina: normal appearing vagina with normal color and discharge, no lesions              Cervix: no lesions               Bimanual Exam:  Uterus:   no masses or tenderness              Adnexa: no mass, fullness, tenderness               Rectovaginal: Confirms               Anus:  normal sphincter tone, no lesions  07/11/21 chaperoned for the exam.  1. Well woman exam Discussed breast self exam Discussed calcium and vit D intake Mammogram and colonoscopy are UTD Labs  with primary  2. Overactive  bladder Worsened in the last year. Will check urine for infection. Discussed limiting caffeine, Kegel exercises and bladder training, information given. Discussed the option of medication. She would like to try medication. Will start low dose ditropan - oxybutynin (DITROPAN XL) 5 MG 24 hr tablet; Take 1 tablet (5 mg total) by mouth at bedtime.  Dispense: 30 tablet; Refill: 1 - Urinalysis,Complete w/RFL Culture -f/u in one month  3. Urge incontinence See above - oxybutynin (DITROPAN XL) 5 MG 24 hr tablet; Take 1 tablet (5 mg total) by mouth at bedtime.  Dispense: 30 tablet; Refill: 1 - Urinalysis,Complete w/RFL Culture  4. BMI 40.0-44.9, adult [facility]) She is seeing a nutritionist Given the # for the weight loss clinic She is walking

## 2021-05-10 ENCOUNTER — Other Ambulatory Visit: Payer: Self-pay

## 2021-05-10 ENCOUNTER — Ambulatory Visit (INDEPENDENT_AMBULATORY_CARE_PROVIDER_SITE_OTHER): Payer: Federal, State, Local not specified - PPO | Admitting: Obstetrics and Gynecology

## 2021-05-10 ENCOUNTER — Encounter: Payer: Self-pay | Admitting: Obstetrics and Gynecology

## 2021-05-10 VITALS — BP 130/74 | HR 64 | Ht 61.0 in | Wt 234.0 lb

## 2021-05-10 DIAGNOSIS — Z6841 Body Mass Index (BMI) 40.0 and over, adult: Secondary | ICD-10-CM | POA: Diagnosis not present

## 2021-05-10 DIAGNOSIS — N3941 Urge incontinence: Secondary | ICD-10-CM | POA: Diagnosis not present

## 2021-05-10 DIAGNOSIS — N3281 Overactive bladder: Secondary | ICD-10-CM

## 2021-05-10 DIAGNOSIS — Z01419 Encounter for gynecological examination (general) (routine) without abnormal findings: Secondary | ICD-10-CM

## 2021-05-10 LAB — URINALYSIS, COMPLETE W/RFL CULTURE
Bacteria, UA: NONE SEEN /HPF
Bilirubin Urine: NEGATIVE
Glucose, UA: NEGATIVE
Hgb urine dipstick: NEGATIVE
Hyaline Cast: NONE SEEN /LPF
Ketones, ur: NEGATIVE
Leukocyte Esterase: NEGATIVE
Nitrites, Initial: NEGATIVE
Protein, ur: NEGATIVE
RBC / HPF: NONE SEEN /HPF (ref 0–2)
Specific Gravity, Urine: 1.025 (ref 1.001–1.035)
WBC, UA: NONE SEEN /HPF (ref 0–5)
pH: 5 (ref 5.0–8.0)

## 2021-05-10 LAB — NO CULTURE INDICATED

## 2021-05-10 MED ORDER — OXYBUTYNIN CHLORIDE ER 5 MG PO TB24: 5.0000 mg | ORAL_TABLET | Freq: Every day | ORAL | 1 refills | Status: AC

## 2021-05-10 NOTE — Patient Instructions (Signed)
EXERCISE   We recommended that you start or continue a regular exercise program for good health. Physical activity is anything that gets your body moving, some is better than none. The CDC recommends 150 minutes per week of Moderate-Intensity Aerobic Activity and 2 or more days of Muscle Strengthening Activity.  Benefits of exercise are limitless: helps weight loss/weight maintenance, improves mood and energy, helps with depression and anxiety, improves sleep, tones and strengthens muscles, improves balance, improves bone density, protects from chronic conditions such as heart disease, high blood pressure and diabetes and so much more. To learn more visit: https://www.cdc.gov/physicalactivity/index.html  DIET: Good nutrition starts with a healthy diet of fruits, vegetables, whole grains, and lean protein sources. Drink plenty of water for hydration. Minimize empty calories, sodium, sweets. For more information about dietary recommendations visit: https://health.gov/our-work/nutrition-physical-activity/dietary-guidelines and https://www.myplate.gov/  ALCOHOL:  Women should limit their alcohol intake to no more than 7 drinks/beers/glasses of wine (combined, not each!) per week. Moderation of alcohol intake to this level decreases your risk of breast cancer and liver damage.  If you are concerned that you may have a problem, or your friends have told you they are concerned about your drinking, there are many resources to help. A well-known program that is free, effective, and available to all people all over the nation is Alcoholics Anonymous.  Check out this site to learn more: https://www.aa.org/   CALCIUM AND VITAMIN D:  Adequate intake of calcium and Vitamin D are recommended for bone health.  You should be getting between 1000-1200 mg of calcium and 800 units of Vitamin D daily between diet and supplements  PAP SMEARS:  Pap smears, to check for cervical cancer or precancers,  have traditionally been  done yearly, scientific advances have shown that most women can have pap smears less often.  However, every woman still should have a physical exam from her gynecologist every year. It will include a breast check, inspection of the vulva and vagina to check for abnormal growths or skin changes, a visual exam of the cervix, and then an exam to evaluate the size and shape of the uterus and ovaries. We will also provide age appropriate advice regarding health maintenance, like when you should have certain vaccines, screening for sexually transmitted diseases, bone density testing, colonoscopy, mammograms, etc.   MAMMOGRAMS:  All women over 40 years old should have a routine mammogram.   COLON CANCER SCREENING: Now recommend starting at age 45. At this time colonoscopy is not covered for routine screening until 50. There are take home tests that can be done between 45-49.   COLONOSCOPY:  Colonoscopy to screen for colon cancer is recommended for all women at age 50.  We know, you hate the idea of the prep.  We agree, BUT, having colon cancer and not knowing it is worse!!  Colon cancer so often starts as a polyp that can be seen and removed at colonscopy, which can quite literally save your life!  And if your first colonoscopy is normal and you have no family history of colon cancer, most women don't have to have it again for 10 years.  Once every ten years, you can do something that may end up saving your life, right?  We will be happy to help you get it scheduled when you are ready.  Be sure to check your insurance coverage so you understand how much it will cost.  It may be covered as a preventative service at no cost, but you should check   your particular policy.      Breast Self-Awareness Breast self-awareness means being familiar with how your breasts look and feel. It involves checking your breasts regularly and reporting any changes to your health care provider. Practicing breast self-awareness is  important. A change in your breasts can be a sign of a serious medical problem. Being familiar with how your breasts look and feel allows you to find any problems early, when treatment is more likely to be successful. All women should practice breast self-awareness, including women who have had breast implants. How to do a breast self-exam One way to learn what is normal for your breasts and whether your breasts are changing is to do a breast self-exam. To do a breast self-exam: Look for Changes  Remove all the clothing above your waist. Stand in front of a mirror in a room with good lighting. Put your hands on your hips. Push your hands firmly downward. Compare your breasts in the mirror. Look for differences between them (asymmetry), such as: Differences in shape. Differences in size. Puckers, dips, and bumps in one breast and not the other. Look at each breast for changes in your skin, such as: Redness. Scaly areas. Look for changes in your nipples, such as: Discharge. Bleeding. Dimpling. Redness. A change in position. Feel for Changes Carefully feel your breasts for lumps and changes. It is best to do this while lying on your back on the floor and again while sitting or standing in the shower or tub with soapy water on your skin. Feel each breast in the following way: Place the arm on the side of the breast you are examining above your head. Feel your breast with the other hand. Start in the nipple area and make  inch (2 cm) overlapping circles to feel your breast. Use the pads of your three middle fingers to do this. Apply light pressure, then medium pressure, then firm pressure. The light pressure will allow you to feel the tissue closest to the skin. The medium pressure will allow you to feel the tissue that is a little deeper. The firm pressure will allow you to feel the tissue close to the ribs. Continue the overlapping circles, moving downward over the breast until you feel your  ribs below your breast. Move one finger-width toward the center of the body. Continue to use the  inch (2 cm) overlapping circles to feel your breast as you move slowly up toward your collarbone. Continue the up and down exam using all three pressures until you reach your armpit.  Write Down What You Find  Write down what is normal for each breast and any changes that you find. Keep a written record with breast changes or normal findings for each breast. By writing this information down, you do not need to depend only on memory for size, tenderness, or location. Write down where you are in your menstrual cycle, if you are still menstruating. If you are having trouble noticing differences in your breasts, do not get discouraged. With time you will become more familiar with the variations in your breasts and more comfortable with the exam. How often should I examine my breasts? Examine your breasts every month. If you are breastfeeding, the best time to examine your breasts is after a feeding or after using a breast pump. If you menstruate, the best time to examine your breasts is 5-7 days after your period is over. During your period, your breasts are lumpier, and it may be more   difficult to notice changes. When should I see my health care provider? See your health care provider if you notice: A change in shape or size of your breasts or nipples. A change in the skin of your breast or nipples, such as a reddened or scaly area. Unusual discharge from your nipples. A lump or thick area that was not there before. Pain in your breasts. Anything that concerns you. Urinary Incontinence  Urinary incontinence refers to a condition in which a person is unable to control where and when to pass urine. A person with this condition will urinate when he or she does not mean to (involuntarily). What are the causes? This condition may be caused by: Medicines. Infections. Constipation. Overactive bladder  muscles. Weak bladder muscles. Weak pelvic floor muscles. These muscles provide support for the bladder, intestine, and, in women, the uterus. Enlarged prostate in men. The prostate is a gland near the bladder. When it gets too big, it can pinch the urethra. With the urethra blocked, the bladder can weaken and lose the ability to empty properly. Surgery. Emotional factors, such as anxiety, stress, or post-traumatic stress disorder (PTSD). Pelvic organ prolapse. This happens in women when organs shift out of place and into the vagina. This shift can prevent the bladder and urethra from working properly. What increases the risk? The following factors may make you more likely to develop this condition: Older age. Obesity and physical inactivity. Pregnancy and childbirth. Menopause. Diseases that affect the nerves or spinal cord (neurological diseases). Long-term (chronic) coughing. This can increase pressure on the bladder and pelvic floor muscles. What are the signs or symptoms? Symptoms may vary depending on the type of urinary incontinence you have. They include: A sudden urge to urinate, but passing urine involuntarily before you can get to a bathroom (urge incontinence). Suddenly passing urine with any activity that forces urine to pass, such as coughing, laughing, exercise, or sneezing (stress incontinence). Needing to urinate often, but urinating only a small amount, or constantly dribbling urine (overflow incontinence). Urinating because you cannot get to the bathroom in time due to a physical disability, such as arthritis or injury, or communication and thinking problems, such as Alzheimer disease (functional incontinence). How is this diagnosed? This condition may be diagnosed based on: Your medical history. A physical exam. Tests, such as: Urine tests. X-rays of your kidney and bladder. Ultrasound. CT scan. Cystoscopy. In this procedure, a health care provider inserts a tube  with a light and camera (cystoscope) through the urethra and into the bladder in order to check for problems. Urodynamic testing. These tests assess how well the bladder, urethra, and sphincter can store and release urine. There are different types of urodynamic tests, and they vary depending on what the test is measuring. To help diagnose your condition, your health care provider may recommend thatyou keep a log of when you urinate and how much you urinate. How is this treated? Treatment for this condition depends on the type of incontinence that you have and its cause. Treatment may include: Lifestyle changes, such as: Quitting smoking. Maintaining a healthy weight. Staying active. Try to get 150 minutes of moderate-intensity exercise every week. Ask your health care provider which activities are safe for you. Eating a healthy diet. Avoid high-fat foods, like fried foods. Avoid refined carbohydrates like white bread and white rice. Limit how much alcohol and caffeine you drink. Increase your fiber intake. Foods such as fresh fruits, vegetables, beans, and whole grains are healthy sources of   fiber. Pelvic floor muscle exercises. Bladder training, such as lengthening the amount of time between bathroom breaks, or using the bathroom at regular intervals. Using techniques to suppress bladder urges. This can include distraction techniques or controlled breathing exercises. Medicines to relax the bladder muscles and prevent bladder spasms. Medicines to help slow or prevent the growth of a man's prostate. Botox injections. These can help relax the bladder muscles. Using pulses of electricity to help change bladder reflexes (electrical nerve stimulation). For women, using a medical device to prevent urine leaks. This is a small, tampon-like, disposable device that is inserted into the urethra. Injecting collagen or carbon beads (bulking agents) into the urinary sphincter. These can help thicken  tissue and close the bladder opening. Surgery. Follow these instructions at home: Lifestyle Limit alcohol and caffeine. These can fill your bladder quickly and irritate it. Keep yourself clean to help prevent odors and skin damage. Ask your doctor about special skin creams and cleansers that can protect the skin from urine. Consider wearing pads or adult diapers. Make sure to change them regularly, and always change them right after experiencing incontinence. General instructions Take over-the-counter and prescription medicines only as told by your health care provider. Use the bathroom about every 3-4 hours, even if you do not feel the need to urinate. Try to empty your bladder completely every time. After urinating, wait a minute. Then try to urinate again. Make sure you are in a relaxed position while urinating. If your incontinence is caused by nerve problems, keep a log of the medicines you take and the times you go to the bathroom. Keep all follow-up visits as told by your health care provider. This is important. Contact a health care provider if: You have pain that gets worse. Your incontinence gets worse. Get help right away if: You have a fever or chills. You are unable to urinate. You have redness in your groin area or down your legs. Summary Urinary incontinence refers to a condition in which a person is unable to control where and when to pass urine. This condition may be caused by medicines, infection, weak bladder muscles, weak pelvic floor muscles, enlargement of the prostate (in men), or surgery. The following factors increase your risk for developing this condition: older age, obesity, pregnancy and childbirth, menopause, neurological diseases, and chronic coughing. There are several types of urinary incontinence. They include urge incontinence, stress incontinence, overflow incontinence, and functional incontinence. This condition is usually treated first with lifestyle and  behavioral changes, such as quitting smoking, eating a healthier diet, and doing regular pelvic floor exercises. Other treatment options include medicines, bulking agents, medical devices, electrical nerve stimulation, or surgery. This information is not intended to replace advice given to you by your health care provider. Make sure you discuss any questions you have with your healthcare provider. Document Revised: 11/01/2017 Document Reviewed: 01/31/2017 Elsevier Patient Education  2021 Else[employer] Inc. Kegel Exercises  Kegel exercises can help strengthen your pelvic floor muscles. The pelvic floor is a group of muscles that support your rectum, small intestine, and bladder. In females, pelvic floor muscles also help support the womb (uterus). These muscles help you control the flow of urine and stool. Kegel exercises are painless and simple, and they do not require any equipment. Your provider may suggest Kegel exercises to: Improve bladder and bowel control. Improve sexual response. Improve weak pelvic floor muscles after surgery to remove the uterus (hysterectomy) or pregnancy (females). Improve weak pelvic floor muscles after prostate gland   removal or surgery (males). Kegel exercises involve squeezing your pelvic floor muscles, which are the same muscles you squeeze when you try to stop the flow of urine or keep from passing gas. The exercises can be done while sitting, standing, or lying down, but itis best to vary your position. Exercises How to do Kegel exercises: Squeeze your pelvic floor muscles tight. You should feel a tight lift in your rectal area. If you are a female, you should also feel a tightness in your vaginal area. Keep your stomach, buttocks, and legs relaxed. Hold the muscles tight for up to 10 seconds. Breathe normally. Relax your muscles. Repeat as told by your health care provider. Repeat this exercise daily as told by your health care provider. Continue to do this  exercise for at least 4-6 weeks, or for as long as told by your healthcare provider. You may be referred to a physical therapist who can help you learn more abouthow to do Kegel exercises. Depending on your condition, your health care provider may recommend: Varying how long you squeeze your muscles. Doing several sets of exercises every day. Doing exercises for several weeks. Making Kegel exercises a part of your regular exercise routine. This information is not intended to replace advice given to you by your health care provider. Make sure you discuss any questions you have with your healthcare provider. Document Revised: 10/12/2020 Document Reviewed: 06/11/2018 Elsevier Patient Education  2022 Elsevier Inc.  

## 2021-06-06 ENCOUNTER — Other Ambulatory Visit: Payer: Self-pay | Admitting: Obstetrics and Gynecology

## 2021-06-06 DIAGNOSIS — N3281 Overactive bladder: Secondary | ICD-10-CM

## 2021-06-06 DIAGNOSIS — N3941 Urge incontinence: Secondary | ICD-10-CM

## 2021-06-06 NOTE — Telephone Encounter (Signed)
The patient was just started on oxybutynin last month. She should come in for a f/u visit, she may need dose adjustment.

## 2021-06-06 NOTE — Telephone Encounter (Signed)
Patient requesting refill on oxybutynin 5 mg daily. Up to date on annual exam.Last annual 05/26/2021. I will route to Provider for approval/ denial.

## 2021-06-29 NOTE — Telephone Encounter (Signed)
Patient aware.

## 2021-07-07 DIAGNOSIS — M25562 Pain in left knee: Secondary | ICD-10-CM | POA: Insufficient documentation

## 2021-11-10 ENCOUNTER — Other Ambulatory Visit: Payer: Self-pay | Admitting: Family Medicine

## 2021-11-10 DIAGNOSIS — Z1231 Encounter for screening mammogram for malignant neoplasm of breast: Secondary | ICD-10-CM

## 2022-03-21 ENCOUNTER — Ambulatory Visit
Admission: RE | Admit: 2022-03-21 | Discharge: 2022-03-21 | Disposition: A | Discharge status: Home / Self Care | Payer: Federal, State, Local not specified - PPO | Source: Ambulatory Visit | Attending: Family Medicine | Admitting: Family Medicine

## 2022-03-21 DIAGNOSIS — Z1231 Encounter for screening mammogram for malignant neoplasm of breast: Secondary | ICD-10-CM

## 2022-04-23 DIAGNOSIS — M545 Low back pain, unspecified: Secondary | ICD-10-CM | POA: Insufficient documentation

## 2022-05-11 NOTE — Progress Notes (Unsigned)
56 y.o. G90P1011 Married Black or [place] American Not Hispanic or Latino female here for annual exam.  No vaginal bleeding. No dyspareunia. No vasomotor symptoms.    She is having sharp pains on the left side of her abdomen, lateral to her umbilicus. The pain has been intermittent in the last 2 months. The pain is sharp, feels superficial in her abdominal wall. She has a h/o IBS, diarrhea, very diet dependent. In the last 2 months she has been having 2 liquid BM's a day, this is not a change.   No bladder c/o.   No LMP recorded. Patient has had an implant.          Sexually active: Yes.    The current method of family planning is ensure .    Exercising: Yes.     Walking and weights  Smoker:  no  Health Maintenance: Pap:  04/21/19 WNL Hr HPV Neg,  12-23-15 WNL NEG HR HPV  History of abnormal Pap:  yes repeat was normal  MMG:  03/21/22 density B Bi-rads 1 neg  BMD:   n/a Colonoscopy:2016 normal per patient, f/u in 10 years.  TDaP:  11/24/12 Gardasil: NA   reports that she has never smoked. She has never used smokeless tobacco. She reports that she does not drink alcohol and does not use drugs. 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 Daughter is in grad school to be a OT (will graduate in 12/23).  Past Medical History:  Diagnosis Date   Allergy    Constipation    Diabetes mellitus without complication (HCC) 09/2013   takes Metformin daily   Diarrhea    Dysphagia    GERD (gastroesophageal reflux disease)    takes Omeprazole daily   H/O hiatal hernia    Heart murmur    Hyperlipidemia    but no meds required    Hypertension    takes HCTZ daily   Hyperthyroidism    takes Ashby Dawes Throid daily   Peripheral neuropathy    takes Lyrica daily   Sleep apnea    uses CPAP-study done in Dec 2014-to request report from [facility] in [place]    Past Surgical History:  Procedure Laterality Date   CHOLECYSTECTOMY     ESOPHAGOGASTRODUODENOSCOPY     ESSURE TUBAL  LIGATION  11/2006   one tube still open   INSERTION OF MESH N/A 12/14/2013   Procedure: INSERTION OF MESH;  Surgeon: Robyne Askew, MD;  Location: [facility] OR;  Service: General;  Laterality: N/A;   TCS     VENTRAL HERNIA REPAIR N/A 12/14/2013   Procedure: LAPAROSCOPIC VENTRAL HERNIA;  Surgeon: Robyne Askew, MD;  Location: [facility] OR;  Service: General;  Laterality: N/A;    Current Outpatient Medications  Medication Sig Dispense Refill   b complex vitamins tablet Take 1 tablet by mouth daily.     Calcium Carbonate (CALTRATE 600 PO) Take 600 mg by mouth daily.      cetirizine (ZYRTEC) 10 MG tablet Take 10 mg by mouth daily.     DULoxetine (CYMBALTA) 30 MG capsule Take by mouth.     Insulin Pen Needle 32G X 4 MM MISC Use with pen device as directed     levothyroxine (SYNTHROID, LEVOTHROID) 50 MCG tablet Take 1 tablet by mouth daily before breakfast.  1   losartan-hydrochlorothiazide (HYZAAR) 100-25 MG tablet Take 1 tablet by mouth daily.  5   LYRICA 225 MG capsule Take 225 mg by mouth  2 (two) times daily.  5   metFORMIN (GLUCOPHAGE) 500 MG tablet Take 1 tablet by mouth 2 (two) times daily.  3   Multiple Vitamin (MULTI-VITAMINS) TABS Take 1 tablet by mouth daily.     oxybutynin (DITROPAN XL) 5 MG 24 hr tablet Take 1 tablet (5 mg total) by mouth at bedtime. 30 tablet 1   pravastatin (PRAVACHOL) 80 MG tablet Take 1 tablet by mouth daily.  3   No current facility-administered medications for this visit.    Family History  Problem Relation Age of Onset   Multiple births Mother    Hypertension Mother    Hypertension Father    Heart disease Father    Heart attack Father    Diabetes Brother    Hypertension Brother    Thyroid disease Paternal Grandmother    Hypertension Brother    Hypertension Sister    Thyroid disease Paternal Aunt    Osteoporosis Other    Breast cancer Neg Hx     Review of Systems  All other systems reviewed and are negative.   Exam:   BP 132/78   Pulse 72   Ht 5\' 1"   (1.549 m)   Wt 230 lb (104.3 kg)   SpO2 99%   BMI 43.46 kg/m   Weight change: @WEIGHTCHANGE @ Height:   Height: 5\' 1"  (154.9 cm)  Ht Readings from Last 3 Encounters:  05/17/22 5\' 1"  (1.549 m)  05/10/21 5\' 1"  (1.549 m)  05/04/20 5' 0.75" (1.543 m)    General appearance: alert, cooperative and appears stated age Head: Normocephalic, without obvious abnormality, atraumatic Neck: no adenopathy, supple, symmetrical, trachea midline and thyroid normal to inspection and palpation Lungs: clear to auscultation bilaterally Cardiovascular: regular rate and rhythm Breasts: normal appearance, no masses or tenderness Abdomen: soft, minimally tender just lateral to and above the umbilicus on the left. No hernia palpated; non distended,  no masses Extremities: extremities normal, atraumatic, no cyanosis or edema Skin: Skin color, texture, turgor normal. No rashes or lesions Lymph nodes: Cervical, supraclavicular, and axillary nodes normal. No abnormal inguinal nodes palpated Neurologic: Grossly normal   Pelvic: External genitalia:  no lesions              Urethra:  normal appearing urethra with no masses, tenderness or lesions              Bartholins and Skenes: normal                 Vagina: normal appearing vagina with normal color and discharge, no lesions              Cervix: no lesions               Bimanual Exam:  Uterus:   no masses or tenderness              Adnexa: no mass, fullness, tenderness               Rectovaginal: Confirms               Anus:  normal sphincter tone, no lesions  05/19/22 chaperoned for the exam.   1. Well woman exam Discussed breast self exam Discussed calcium and vit D intake No pap this year Mammogram and colonoscopy are UTD Labs with primary  2. Periumbilical abdominal pain Not sure if her pain is MS or GI. She has long term GI issues. Not c/w GYN pain. Recommended she f/u with her primary

## 2022-05-17 ENCOUNTER — Ambulatory Visit (INDEPENDENT_AMBULATORY_CARE_PROVIDER_SITE_OTHER): Payer: Federal, State, Local not specified - PPO | Admitting: Obstetrics and Gynecology

## 2022-05-17 ENCOUNTER — Encounter: Payer: Self-pay | Admitting: Obstetrics and Gynecology

## 2022-05-17 VITALS — BP 132/78 | HR 72 | Ht 61.0 in | Wt 230.0 lb

## 2022-05-17 DIAGNOSIS — R1310 Dysphagia, unspecified: Secondary | ICD-10-CM | POA: Insufficient documentation

## 2022-05-17 DIAGNOSIS — Z1211 Encounter for screening for malignant neoplasm of colon: Secondary | ICD-10-CM | POA: Insufficient documentation

## 2022-05-17 DIAGNOSIS — Z01419 Encounter for gynecological examination (general) (routine) without abnormal findings: Secondary | ICD-10-CM | POA: Diagnosis not present

## 2022-05-17 DIAGNOSIS — R1033 Periumbilical pain: Secondary | ICD-10-CM | POA: Diagnosis not present

## 2022-05-17 DIAGNOSIS — K589 Irritable bowel syndrome without diarrhea: Secondary | ICD-10-CM | POA: Insufficient documentation

## 2022-05-17 DIAGNOSIS — K529 Noninfective gastroenteritis and colitis, unspecified: Secondary | ICD-10-CM | POA: Insufficient documentation

## 2022-05-17 NOTE — Patient Instructions (Signed)

## 2022-05-24 ENCOUNTER — Ambulatory Visit: Payer: Federal, State, Local not specified - PPO | Admitting: Obstetrics and Gynecology

## 2022-11-06 ENCOUNTER — Other Ambulatory Visit: Payer: Self-pay | Admitting: Family Medicine

## 2022-11-06 DIAGNOSIS — Z1231 Encounter for screening mammogram for malignant neoplasm of breast: Secondary | ICD-10-CM

## 2022-11-26 HISTORY — PX: BARIATRIC SURGERY: SHX1103

## 2023-01-06 IMAGING — MG MM DIGITAL SCREENING BILAT W/ TOMO AND CAD
8 series · 8 of 24 positions shown · non-contrast
Comparison: Previous exam(s).

CLINICAL DATA: Screening.

EXAM:
DIGITAL SCREENING BILATERAL MAMMOGRAM WITH TOMOSYNTHESIS AND CAD
TECHNIQUE: Bilateral screening digital craniocaudal and mediolateral oblique
mammograms were obtained. Bilateral screening digital breast
tomosynthesis was performed. The images were evaluated with
computer-aided detection.

[L MLO synth-2D]
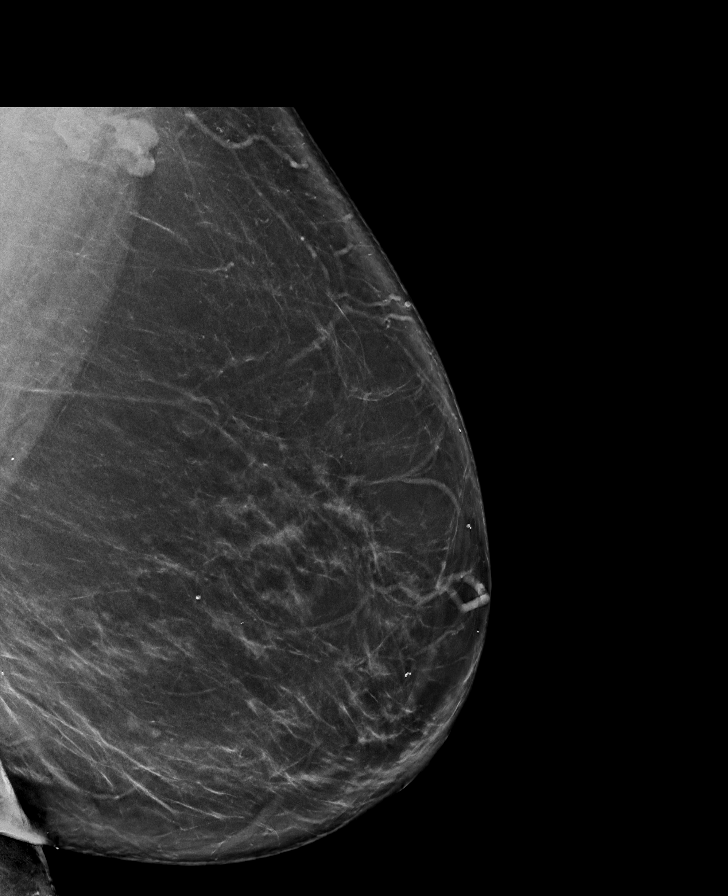

[R MLO synth-2D]
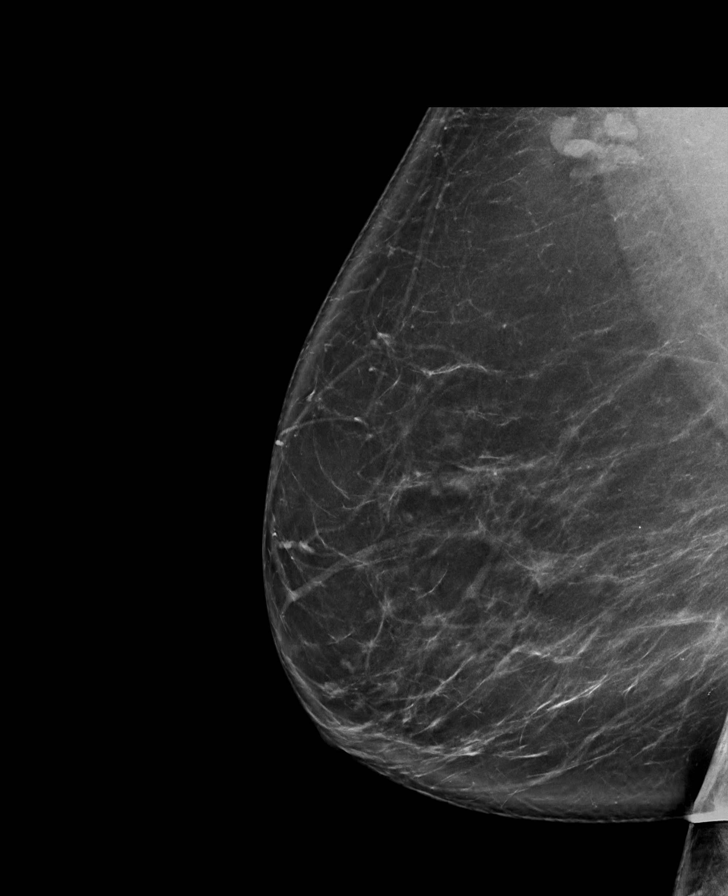

[L CC synth-2D]
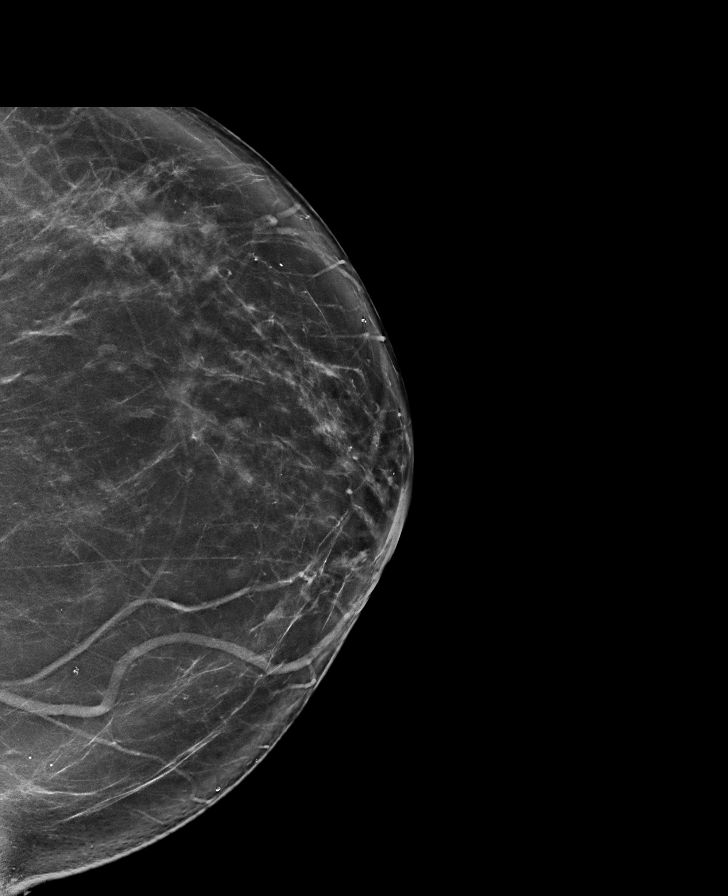

[R CC synth-2D]
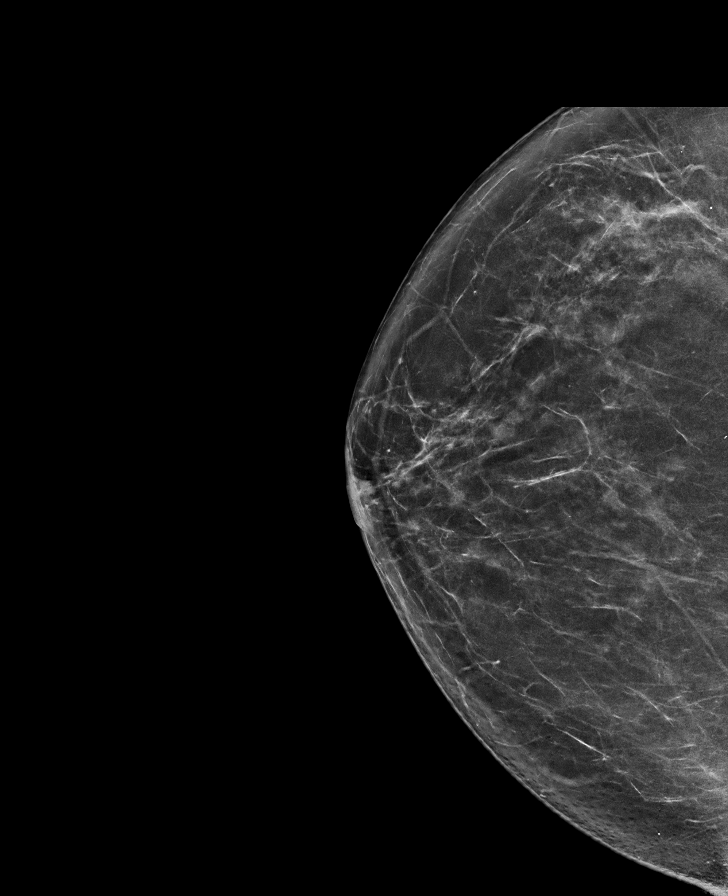

[R MLO tomo · tomo slice 52/103.0]
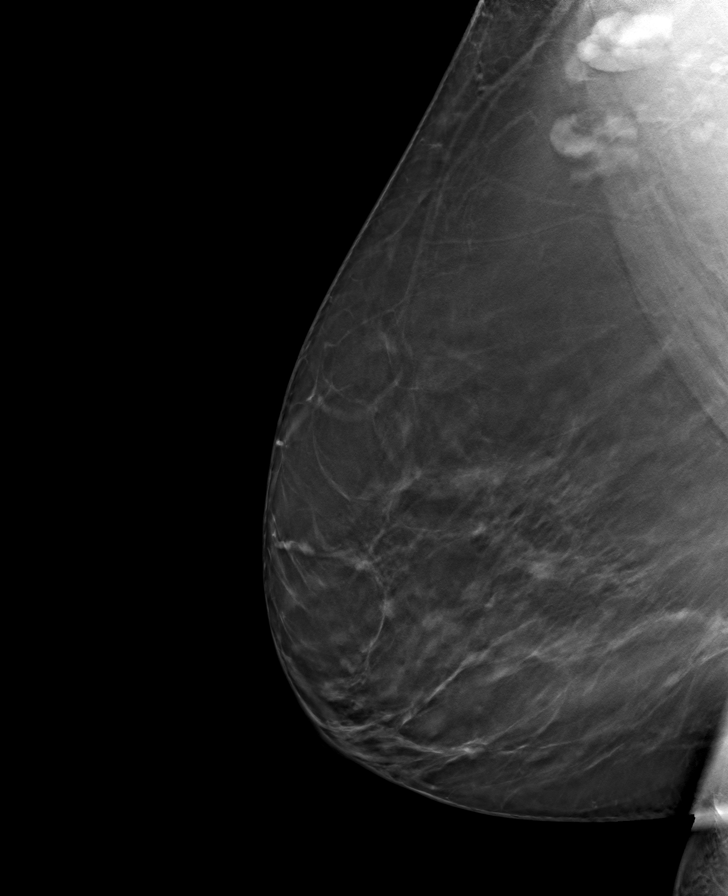

[L CC tomo · tomo slice 47/92.0]
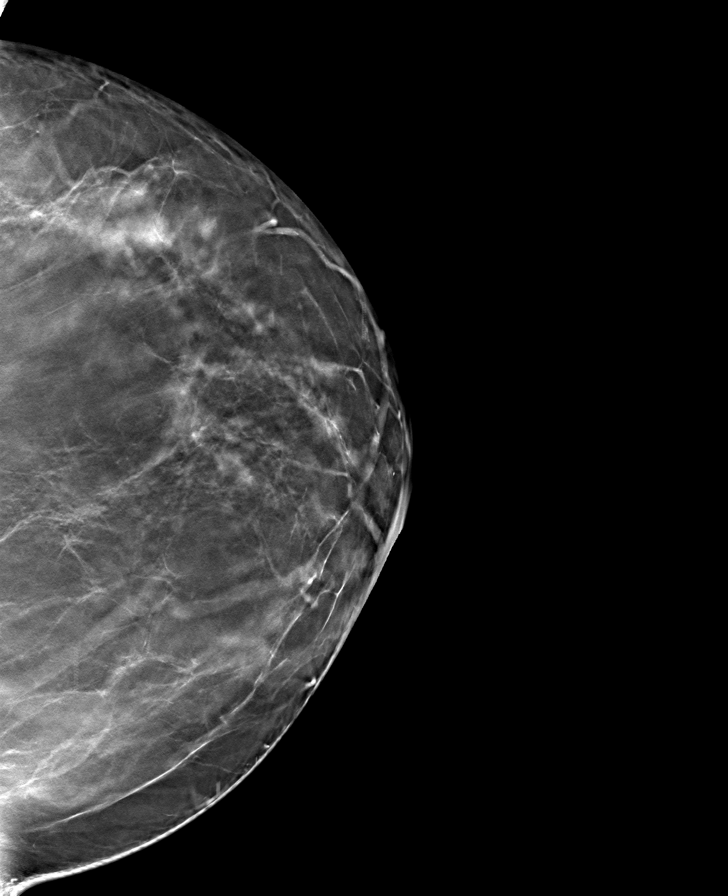

[L MLO tomo · tomo slice 50/99.0]
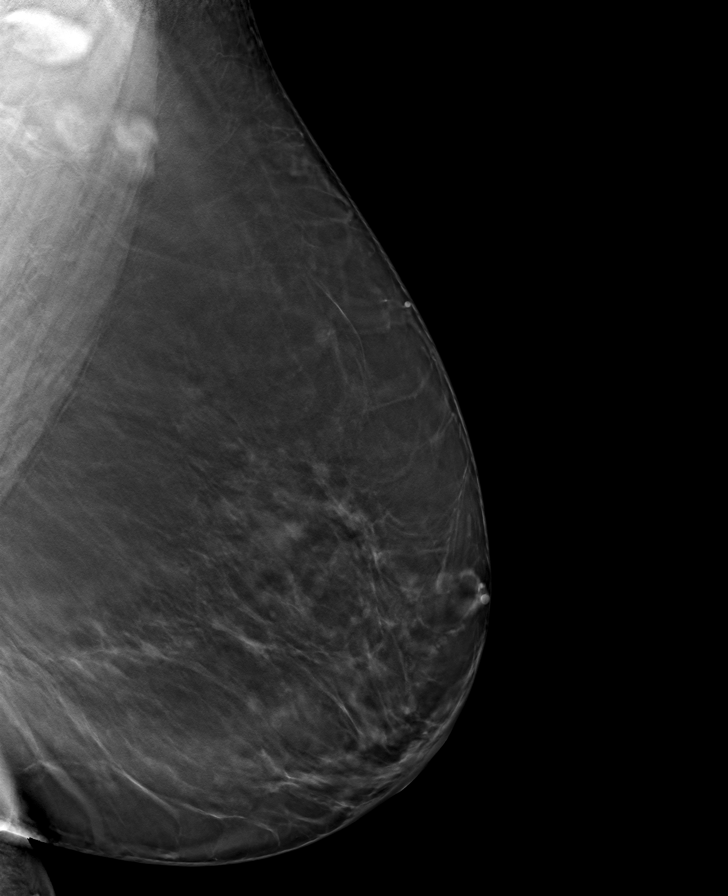

[R CC tomo · tomo slice 43/85.0]
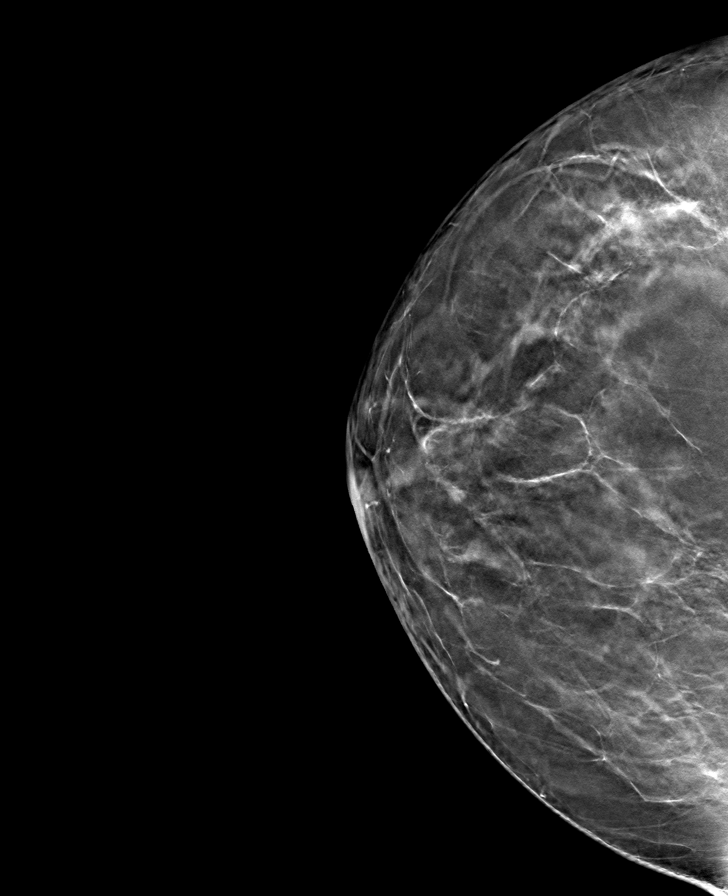

[8 of 24 positions shown; findings below may reference images not displayed]

ACR Breast Density Category b: There are scattered areas of
fibroglandular density.
FINDINGS: There are no findings suspicious for malignancy.
IMPRESSION: No mammographic evidence of malignancy. A result letter of this
screening mammogram will be mailed directly to the patient.

RECOMMENDATION:
Screening mammogram in one year. (Code:51-O-LD2)

BI-RADS CATEGORY  1: Negative.

## 2023-02-16 ENCOUNTER — Encounter: Payer: Self-pay | Admitting: *Deleted

## 2023-02-16 LAB — AMB RESULTS CONSOLE CBG: Glucose: 5.7

## 2023-03-04 ENCOUNTER — Encounter: Payer: Self-pay | Admitting: *Deleted

## 2023-03-04 NOTE — Progress Notes (Signed)
Per chart review, pt attended 02/16/23 screening event where her A1C was 5.7. No additional notes documented in [facility]. Per [facility] review, pt has PCP Dr. Delbert Harness at [facility] and no SDOH noted in medical record at this time. Chart review also reveals pt has ongoing bariatric, gyn support. No additional health equity team support indicated at this time.

## 2023-03-25 ENCOUNTER — Ambulatory Visit
Admission: RE | Admit: 2023-03-25 | Discharge: 2023-03-25 | Disposition: A | Discharge status: Home / Self Care | Payer: Federal, State, Local not specified - PPO | Source: Ambulatory Visit

## 2023-03-25 DIAGNOSIS — Z1231 Encounter for screening mammogram for malignant neoplasm of breast: Secondary | ICD-10-CM

## 2023-03-27 ENCOUNTER — Other Ambulatory Visit: Payer: Self-pay | Admitting: Family Medicine

## 2023-03-27 DIAGNOSIS — R928 Other abnormal and inconclusive findings on diagnostic imaging of breast: Secondary | ICD-10-CM

## 2023-04-05 ENCOUNTER — Ambulatory Visit
Admission: RE | Admit: 2023-04-05 | Discharge: 2023-04-05 | Disposition: A | Discharge status: Home / Self Care | Payer: Federal, State, Local not specified - PPO | Source: Ambulatory Visit | Attending: Family Medicine | Admitting: Family Medicine

## 2023-04-05 DIAGNOSIS — R928 Other abnormal and inconclusive findings on diagnostic imaging of breast: Secondary | ICD-10-CM

## 2023-05-22 ENCOUNTER — Ambulatory Visit: Payer: Federal, State, Local not specified - PPO | Admitting: Obstetrics and Gynecology

## 2023-05-24 ENCOUNTER — Ambulatory Visit (INDEPENDENT_AMBULATORY_CARE_PROVIDER_SITE_OTHER): Payer: Federal, State, Local not specified - PPO | Admitting: Radiology

## 2023-05-24 ENCOUNTER — Encounter: Payer: Self-pay | Admitting: Radiology

## 2023-05-24 VITALS — BP 112/70 | HR 52 | Ht 60.0 in | Wt 173.0 lb

## 2023-05-24 DIAGNOSIS — Z01419 Encounter for gynecological examination (general) (routine) without abnormal findings: Secondary | ICD-10-CM

## 2023-05-24 NOTE — Progress Notes (Signed)
   THERMA LASURE [birth year] [id number]   History:  57 y.o. G2P1 presents for annual exam. No gyn concerns. Doing well. Up to date on screenings. Followed by PCP for chronic conditions. S/p bariatric surgery 1/24.  Gynecologic History No LMP recorded. Patient has had an implant.   Contraception/Family planning: post menopausal status Sexually active: yes Last Pap: 2020. Results were: normal Last mammogram: 2024. Results were: normal  Obstetric History OB History  Gravida Para Term Preterm AB Living  2 1 1  0 1 1  SAB IAB Ectopic Multiple Live Births  1 0 0 0 1    # Outcome Date GA Lbr Len/2nd Weight Sex Type Anes PTL Lv  2 Term 38    F Vag-Spont   LIV  1 SAB              The following portions of the patient's history were reviewed and updated as appropriate: allergies, current medications, past family history, past medical history, past social history, past surgical history, and problem list.  Review of Systems Pertinent items noted in HPI and remainder of comprehensive ROS otherwise negative.   Past medical history, past surgical history, family history and social history were all reviewed and documented in the EPIC chart.   Exam:  There were no vitals filed for this visit. There is no height or weight on file to calculate BMI.  General appearance:  Normal Thyroid:  Symmetrical, normal in size, without palpable masses or nodularity. Respiratory  Auscultation:  Clear without wheezing or rhonchi Cardiovascular  Auscultation:  Regular rate, without rubs, murmurs or gallops  Edema/varicosities:  Not grossly evident Abdominal  Soft,nontender, without masses, guarding or rebound.  Liver/spleen:  No organomegaly noted  Hernia:  None appreciated  Skin  Inspection:  Grossly normal Breasts: Examined lying and sitting.   Right: Without masses, retractions, nipple discharge or axillary adenopathy.   Left: Without masses, retractions, nipple discharge or axillary  adenopathy. Genitourinary   Inguinal/mons:  Normal without inguinal adenopathy  External genitalia:  Normal appearing vulva with no masses, tenderness, or lesions  BUS/Urethra/Skene's glands:  Normal without masses or exudate  Vagina:  Normal appearing with normal color and discharge, no lesions  Cervix:  Normal appearing without discharge or lesions  Uterus:  Normal in size, shape and contour.  Mobile, nontender  Adnexa/parametria:     Rt: Normal in size, without masses or tenderness.   Lt: Normal in size, without masses or tenderness.  Anus and perineum: Normal   Raynelle Fanning, CMA present for exam  Assessment/Plan:   1. Well woman exam with routine gynecological exam Pap 2025 Yearly mammogram Colonoscopy up to date     Discussed SBE, colonoscopy and DEXA screening as directed/appropriate. Recommend of exercise weekly, including weight bearing exercise. Encouraged the use of seatbelts and sunscreen. Return in 1 year for annual or as needed.   Arlie Solomons B WHNP-BC 8:01 AM 05/24/2023

## 2023-06-12 ENCOUNTER — Other Ambulatory Visit: Payer: Self-pay | Admitting: Family Medicine

## 2023-06-12 DIAGNOSIS — Z1231 Encounter for screening mammogram for malignant neoplasm of breast: Secondary | ICD-10-CM

## 2023-12-17 ENCOUNTER — Other Ambulatory Visit: Payer: Self-pay | Admitting: Student

## 2023-12-17 DIAGNOSIS — R748 Abnormal levels of other serum enzymes: Secondary | ICD-10-CM

## 2023-12-25 ENCOUNTER — Ambulatory Visit
Admission: RE | Admit: 2023-12-25 | Discharge: 2023-12-25 | Disposition: A | Discharge status: Home / Self Care | Payer: Federal, State, Local not specified - PPO | Source: Ambulatory Visit | Attending: Student | Admitting: Student

## 2023-12-25 DIAGNOSIS — R748 Abnormal levels of other serum enzymes: Secondary | ICD-10-CM

## 2024-03-27 ENCOUNTER — Ambulatory Visit: Payer: Federal, State, Local not specified - PPO

## 2024-04-03 ENCOUNTER — Ambulatory Visit
Admission: RE | Admit: 2024-04-03 | Discharge: 2024-04-03 | Disposition: A | Discharge status: Home / Self Care | Payer: Federal, State, Local not specified - PPO | Source: Ambulatory Visit | Attending: Family Medicine | Admitting: Family Medicine

## 2024-04-03 DIAGNOSIS — Z1231 Encounter for screening mammogram for malignant neoplasm of breast: Secondary | ICD-10-CM

## 2024-06-12 ENCOUNTER — Other Ambulatory Visit: Payer: Self-pay | Admitting: Medical Genetics

## 2024-06-15 ENCOUNTER — Encounter: Payer: Self-pay | Admitting: Internal Medicine

## 2024-06-15 ENCOUNTER — Other Ambulatory Visit
Admission: RE | Admit: 2024-06-15 | Discharge: 2024-06-15 | Disposition: A | Discharge status: Home / Self Care | Payer: Self-pay | Source: Ambulatory Visit | Attending: Medical Genetics | Admitting: Medical Genetics

## 2024-06-15 ENCOUNTER — Ambulatory Visit: Admitting: Internal Medicine

## 2024-06-15 ENCOUNTER — Other Ambulatory Visit: Payer: Self-pay

## 2024-06-15 VITALS — BP 124/68 | HR 66 | Temp 98.0°F | Resp 18 | Ht 60.0 in | Wt 172.0 lb

## 2024-06-15 DIAGNOSIS — T781XXD Other adverse food reactions, not elsewhere classified, subsequent encounter: Secondary | ICD-10-CM | POA: Diagnosis not present

## 2024-06-15 DIAGNOSIS — L508 Other urticaria: Secondary | ICD-10-CM

## 2024-06-15 DIAGNOSIS — Z91040 Latex allergy status: Secondary | ICD-10-CM | POA: Diagnosis not present

## 2024-06-15 DIAGNOSIS — J3089 Other allergic rhinitis: Secondary | ICD-10-CM

## 2024-06-15 DIAGNOSIS — T781XXA Other adverse food reactions, not elsewhere classified, initial encounter: Secondary | ICD-10-CM

## 2024-06-15 NOTE — Patient Instructions (Signed)
 Evaluation of possible food allergy reactions (peanuts, pineapple, shrimp, Crystal Light) Differential includes true food allergy versus oral allergy syndrome/ latex food syndrome. Distinguishing between true food allergy and oral allergy syndrome is crucial for management and risk assessment. Explained that if oral allergy syndrome is confirmed, risk is low and avoidance is recommended as tolerated. If true food allergy is confirmed, strict avoidance and EpiPen  use are necessary. - Schedule food allergy  and environmental allergy testing.  -avoid all antihistamines for 3 days prior  - Advise avoidance of suspected foods until testing is complete.  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 Explained avoidance of latex and certain foods that may cross-react with latex due to latex food syndrome.  Environmental allergies (allergic rhinitis) Environmental allergies previously treated with allergy injections for at least 4 years. Potential for oral allergy syndrome related to environmental allergens such as dust mites or pollen. - Schedule environmental allergy testing. - Consider resuming allergy injections if environmental allergies are confirmed.   Follow up: for allergy testing (1-55, PN, pineapple, strawberry, grape, shellfish)

## 2024-06-15 NOTE — Progress Notes (Signed)
 NEW PATIENT Date of Service/Encounter:  06/15/24 Referring provider: Rena Luke POUR, MD Primary care provider: Rena Luke POUR, MD  Subjective:  Samantha [name] is a 58 y.o. female  presenting today for evaluation of food allergy  History obtained from: chart review and patient.   Discussed the use of AI scribe software for clinical note transcription with the patient, who gave verbal consent to proceed.  History of Present Illness Samantha [name] is a 58 year old female with a latex allergy who presents with concerns about food-related allergic reactions.  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 itching upon contact  Seafood-related cutaneous reactions - Consumes seafood without issues except for shrimp - Shrimp sometimes causes hives depending on source or preparation method - Has recently avoided shrimp and switched to eating fish  Environmental allergies - Previously received allergy shots for environmental allergies, last seen in 2012 - No recent allergy testing  Rhinorrhea - Experiences runny nose at night, associated with tiredness - No runny or stuffy nose during the day  Dietary modifications - History of bariatric surgery influencing dietary habits - Uses Crystal Light to meet daily water intake      Other allergy screening: Asthma: no Rhino conjunctivitis: yes Food allergy: yes Medication allergy: latex allergy  Hymenoptera allergy: no Urticaria: no Eczema:no History of recurrent infections suggestive of  immunodeficency: no Vaccinations are up to date.   Past Medical History: Past Medical History:  Diagnosis Date   Allergy    Constipation    Diabetes mellitus without complication (HCC) 09/05/2013   takes Metformin  daily   Diarrhea    Dysphagia    GERD (gastroesophageal reflux disease)    takes Omeprazole daily   H/O hiatal hernia    Heart murmur    Hyperlipidemia    but no meds required    Hypertension    takes HCTZ daily   Hyperthyroidism    takes Lysle Throid daily   Peripheral neuropathy    takes Lyrica  daily   Sleep apnea    uses CPAP-study done in Dec 2014-to request report from [facility] in [place]   Medication List:  Current Outpatient Medications  Medication Sig Dispense Refill   amLODipine (NORVASC) 10 MG tablet Take 10 mg by mouth daily.     atorvastatin (LIPITOR) 40 MG tablet Take 40 mg by mouth daily as needed.     b complex vitamins tablet Take 1 tablet by mouth daily.     celecoxib (CELEBREX) 200 MG capsule Take 200 mg by mouth 2 (two) times daily.     cetirizine (ZYRTEC) 10 MG tablet Take 10 mg by mouth daily.     DULoxetine (CYMBALTA) 30 MG capsule Take by mouth.     levothyroxine (SYNTHROID, LEVOTHROID) 50 MCG tablet Take 1 tablet by mouth daily before breakfast.  1   losartan (COZAAR) 100 MG tablet Take 100 mg by mouth daily.     losartan (COZAAR) 50 MG tablet Take by mouth.     losartan-hydrochlorothiazide  (HYZAAR) 100-25 MG tablet 1 tablet Orally Once a day; Duration: 30 day(s)  LYRICA  225 MG capsule Take 225 mg by mouth 2 (two) times daily.  5   meloxicam (MOBIC) 15 MG tablet Take 1 tablet by mouth daily as needed.     metFORMIN  (GLUCOPHAGE ) 500 MG tablet Take 1 tablet by mouth 2 (two) times daily.  3   metFORMIN  (GLUCOPHAGE ) 500 MG tablet Take 1,000 mg by mouth.     metFORMIN  (GLUCOPHAGE -XR) 500 MG 24 hr tablet Take 500 mg by mouth every morning.     methocarbamol  (ROBAXIN ) 500 MG tablet Take 500 mg by mouth every 8 (eight) hours as  needed.     Multiple Vitamin (MULTI-VITAMINS) TABS Take 1 tablet by mouth daily.     omeprazole (PRILOSEC) 40 MG capsule Take 40 mg by mouth daily.     oxybutynin  (DITROPAN  XL) 5 MG 24 hr tablet Take 1 tablet (5 mg total) by mouth at bedtime. 30 tablet 1   pravastatin (PRAVACHOL) 80 MG tablet Take 1 tablet by mouth daily.  3   No current facility-administered medications for this visit.   Known Allergies:  Allergies  Allergen Reactions   Milk (Cow) Itching   Other Itching    Bananas and Grapes   Latex Hives   Bisacodyl Rash   Lisinopril Cough   Past Surgical History: Past Surgical History:  Procedure Laterality Date   BARIATRIC SURGERY  11/26/2022   CHOLECYSTECTOMY     ESOPHAGOGASTRODUODENOSCOPY     ESSURE TUBAL LIGATION  11/05/2006   one tube still open   INSERTION OF MESH N/A 12/14/2013   Procedure: INSERTION OF MESH;  Surgeon: Deward GORMAN Curvin DOUGLAS, MD;  Location: [facility] OR;  Service: General;  Laterality: N/A;   TCS     VENTRAL HERNIA REPAIR N/A 12/14/2013   Procedure: LAPAROSCOPIC VENTRAL HERNIA;  Surgeon: Deward GORMAN Curvin DOUGLAS, MD;  Location: [facility] OR;  Service: General;  Laterality: N/A;   Family History: Family History  Problem Relation Age of Onset   Multiple births Mother    Hypertension Mother    Hypertension Father    Heart disease Father    Heart attack Father    Asthma Sister    Angioedema Sister    Allergic rhinitis Sister    Hypertension Sister    Angioedema Brother    Diabetes Brother    Hypertension Brother    Hypertension Brother    Thyroid  disease Paternal Aunt    Thyroid  disease Paternal Grandmother    Osteoporosis Other    Breast cancer Neg Hx    Atopy Neg Hx    Social History: Irys lives Single, family home at 58 years old.  No water damage.  Carpet throughout.  Electric heating and central cooling.  No pets.  Originally house that she was up for.  No dust mite precautions.  No tobacco exposure.  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  ROS:   All other systems negative except as noted per HPI.  Objective:  Blood pressure 124/68, pulse 66, temperature 98 F (36.7 C), temperature source Temporal, resp. rate 18, height 5' (1.524 m), weight 172 lb (78 kg), SpO2 99%. Body mass index is 33.59 kg/m. Physical Exam:  General Appearance:  Alert, cooperative, no distress, appears stated age  Head:  Normocephalic, without obvious abnormality, atraumatic  Eyes:  Conjunctiva clear, EOM's intact  Ears EACs normal bilaterally and normal TMs bilaterally  Nose: Nares normal, normal mucosa, no visible anterior polyps, and septum midline  Throat: Lips, tongue normal; teeth and gums normal, normal posterior oropharynx  Neck: Supple, symmetrical  Lungs:   clear to auscultation bilaterally, Respirations unlabored, no coughing  Heart:  regular rate and rhythm and no murmur, Appears well perfused  Extremities: No edema  Skin: Skin color, texture, turgor normal and no rashes or lesions on visualized portions of skin  Neurologic: No gross deficits   Diagnostics: None done    Labs:  Lab Orders  No laboratory test(s) ordered today     Assessment and Plan  Assessment and Plan Assessment & Plan Evaluation of possible food allergy reactions (peanuts, pineapple, shrimp, Crystal Light) Differential includes true food allergy versus oral allergy syndrome/ latex food syndrome. Distinguishing between true food allergy and oral allergy syndrome is crucial for management and risk assessment. Explained that if oral allergy syndrome is confirmed, risk is low and avoidance is recommended as tolerated. If true food allergy is confirmed, strict avoidance and EpiPen  use are necessary. - Schedule food allergy  and environmental allergy testing.  -avoid all antihistamines for 3 days prior  - Advise avoidance of suspected foods until testing is complete.  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 Explained avoidance of latex and  certain foods that may cross-react with latex due to latex food syndrome.  Environmental allergies (allergic rhinitis) Environmental allergies previously treated with allergy injections for at least 4 years. Potential for oral allergy syndrome related to environmental allergens such as dust mites or pollen. - Schedule environmental allergy testing. - Consider resuming allergy injections if environmental allergies are confirmed.   Follow up: for allergy testing (1-55, PN, pineapple, strawberry, grape, shellfish)      This note in its entirety was forwarded to the Provider who requested this consultation.  Other:    Thank you for your kind referral. I appreciate the opportunity to take part in Yamaira's care. Please do not hesitate to contact me with questions.  Sincerely,  Thank you so much for letting me partake in your care today.  Don't hesitate to reach out if you have any additional concerns!  Hargis Springer, MD  [facility] [place] [place] [place]

## 2024-06-26 LAB — GENECONNECT MOLECULAR SCREEN: Genetic Analysis Overall Interpretation: NEGATIVE

## 2024-07-14 ENCOUNTER — Ambulatory Visit: Admitting: Internal Medicine

## 2024-07-14 DIAGNOSIS — T781XXD Other adverse food reactions, not elsewhere classified, subsequent encounter: Secondary | ICD-10-CM

## 2024-07-14 DIAGNOSIS — J302 Other seasonal allergic rhinitis: Secondary | ICD-10-CM

## 2024-07-14 DIAGNOSIS — J3089 Other allergic rhinitis: Secondary | ICD-10-CM

## 2024-07-14 NOTE — Progress Notes (Signed)
 Date of Service/Encounter:  07/14/24  Allergy  testing appointment   Initial visit on 06/15/24, seen for food allergy , rhinitis .  Please see that note for additional details.  Today reports for allergy  diagnostic testing:    DIAGNOSTICS:  Skin Testing: Environmental allergy  panel and select foods. Adequate positive and negative controls Results discussed with patient/family.  Airborne Adult Perc - 07/14/24 1022     Time Antigen Placed 1022    Allergen Manufacturer Greer    Location Back    Number of Test 55    1. Control-Buffer 50% Glycerol Negative    2. Control-Histamine 4+    3. Bahia Negative    4. French Southern Territories Negative    5. Johnson Negative    6. Kentucky  Blue Negative    7. Meadow Fescue Negative    8. Perennial Rye Negative    9. Timothy Negative    10. Ragweed Mix Negative    11. Cocklebur Negative    12. Plantain,  English Negative    13. Baccharis Negative    14. Dog Fennel Negative    15. Russian Thistle Negative    16. Lamb's Quarters Negative    17. Sheep Sorrell Negative    18. Rough Pigweed Negative    19. Marsh Elder, Rough Negative    20. Mugwort, Common Negative    21. Box, Elder Negative    22. Cedar, red Negative    23. Sweet Gum Negative    24. Pecan Pollen Negative    25. Pine Mix Negative    26. Walnut, Black Pollen Negative    27. Red Mulberry Negative    28. Ash Mix Negative    29. Birch Mix Negative    30. Beech American Negative    31. Cottonwood, Guinea-Bissau Negative    32. Hickory, White Negative    33. Maple Mix Negative    34. Oak, Guinea-Bissau Mix Negative    35. Sycamore Eastern Negative    36. Alternaria Alternata Negative    37. Cladosporium Herbarum Negative    38. Aspergillus Mix Negative    39. Penicillium Mix Negative    40. Bipolaris Sorokiniana (Helminthosporium) Negative    41. Drechslera Spicifera (Curvularia) Negative    42. Mucor Plumbeus Negative    43. Fusarium Moniliforme Negative    44. Aureobasidium Pullulans  (pullulara) Negative    45. Rhizopus Oryzae Negative    46. Botrytis Cinera Negative    47. Epicoccum Nigrum Negative    48. Phoma Betae Negative    49. Dust Mite Mix 2+    50. Cat Hair 10,000 BAU/ml Negative    51.  Dog Epithelia Negative    52. Mixed Feathers Negative    53. Horse Epithelia Negative    54. Cockroach, German Negative    55. Tobacco Leaf Negative          Intradermal - 07/14/24 1059     Time Antigen Placed 1059    Allergen Manufacturer Jestine    Location Arm    Number of Test 15    Control Negative    Bahia Negative    French Southern Territories Negative    Johnson Negative    7 Grass Negative    Ragweed Mix Negative    Weed Mix Negative    Tree Mix 2+    Mold 1 2+    Mold 2 Negative    Mold 3 2+    Mold 4 Negative    Cat Negative    Dog Negative  Cockroach 2+    Other Omitted          Food Adult Perc - 07/14/24 1000     Time Antigen Placed 1023    Allergen Manufacturer Jestine    Location Back    Number of allergen test 9    1. Peanut Negative    23. Shrimp Negative    24. Crab Negative    25. Lobster Negative    26. Oyster Negative    27. Scallops Negative    54. Grape (White seedless) Negative    60. Strawberry Negative    65. Pineapple Negative          Allergy  testing results were read and interpreted by myself, documented by clinical staff.  Patient provided with copy of allergy  testing along with avoidance measures when indicated.   Hargis Springer, MD  [facility] 

## 2024-07-14 NOTE — Patient Instructions (Addendum)
 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 Explained avoidance of latex and certain foods that may cross-react with latex due to latex food syndrome.  Environmental allergies (allergic rhinitis) Environmental allergies previously treated with allergy  injections for at least 4 years. Potential for oral allergy  syndrome related to environmental allergens such as dust mites or pollen. - Allergy  test (07/14/24): Positive to tree pollen, molds, dust mite, cockroach - Consider resuming allergy  injections  - Consider daily antihistamine such as Zyrtec, Xyzal, Allegra or Claritin  - Consider daily intranasal steroid such as Flonase.  1 spray per each nostril twice daily.  Aim upward and outward   Follow up: in 6 months, let us  know if you want to restart allergy  injections sooner  DUST MITE AVOIDANCE MEASURES:  There are three main measures that need and can be taken to avoid house dust mites:  Reduce accumulation of dust in general -reduce furniture, clothing, carpeting, books, stuffed animals, especially in bedroom  Separate yourself from the dust -use pillow and mattress encasements (can be found at stores such as [employer] or online) -avoid direct exposure to air condition flow -use a HEPA filter device, especially in the bedroom; you can also use a HEPA filter vacuum cleaner -wipe dust with a moist towel instead of a dry towel or broom when cleaning  Decrease mites and/or their secretions -wash clothing and linen and stuffed animals at highest temperature possible, at least every 2 weeks -stuffed animals can also be placed in a bag and put in a freezer overnight  Despite the above measures, it is impossible to eliminate dust mites or their allergen completely from your home.  With the above  measures the burden of mites in your home can be diminished, with the goal of minimizing your allergic symptoms.  Success will be reached only when implementing and using all means together.  Control of Mold Allergen   Mold and fungi can grow on a variety of surfaces provided certain temperature and moisture conditions exist.  Outdoor molds grow on plants, decaying vegetation and soil.  The major outdoor mold, Alternaria and Cladosporium, are found in very high numbers during hot and dry conditions.  Generally, a late Summer - Fall peak is seen for common outdoor fungal spores.  Rain will temporarily lower outdoor mold spore count, but counts rise rapidly when the rainy period ends.  The most important indoor molds are Aspergillus and Penicillium.  Dark, humid and poorly ventilated basements are ideal sites for mold growth.  The next most common sites of mold growth are the bathroom and the kitchen.  Outdoor (Seasonal) Mold Control  Use air conditioning and keep windows closed Avoid exposure to decaying vegetation. Avoid leaf raking. Avoid grain handling. Consider wearing a face mask if working in moldy areas.    Indoor (Perennial) Mold Control   Maintain humidity below 50%. Clean washable surfaces with 5% bleach solution. Remove sources e.g. contaminated carpets.    Reducing Pollen Exposure  The [employer] of All[employer] , [employer] suggests the following steps to reduce your exposure to pollen during allergy  seasons.    Do not hang sheets or clothing out to dry; pollen may collect on these items. Do not mow lawns or spend time around freshly cut grass; mowing stirs up pollen. Keep windows closed at night.  Keep car windows closed while driving. Minimize morning activities outdoors, a time when pollen counts are usually at their highest. Stay indoors as much as possible when pollen counts or humidity is high and on windy days when pollen tends to remain in the air  longer. Use air conditioning when possible.  Many air conditioners have filters that trap the pollen spores. Use a HEPA room air filter to remove pollen form the indoor air you breathe. Control of Cockroach Allergen  Cockroach allergen has been identified as an important cause of acute attacks of asthma, especially in urban settings.  There are fifty-five species of cockroach that exist in the [place] , however only three, the [place], [place] and [place] species produce allergen that can affect patients with Asthma.  Allergens can be obtained from fecal particles, egg casings and secretions from cockroaches.    Remove food sources. Reduce access to water. Seal access and entry points. Spray runways with 0.5-1% Diazinon or Chlorpyrifos Blow boric acid power under stoves and refrigerator. Place bait stations (hydramethylnon) at feeding sites.
